# Patient Record
Sex: Female | Born: 1980 | Race: Black or African American | Hispanic: No | Marital: Single | Smoking: Former smoker
Health system: Southern US, Community
[De-identification: ages and names within clinical notes are randomized; demographics above are authoritative.]

## PROBLEM LIST (undated history)

## (undated) DIAGNOSIS — Z1509 Genetic susceptibility to other malignant neoplasm: Secondary | ICD-10-CM

## (undated) DIAGNOSIS — F32A Depression, unspecified: Secondary | ICD-10-CM

## (undated) DIAGNOSIS — F329 Major depressive disorder, single episode, unspecified: Secondary | ICD-10-CM

## (undated) DIAGNOSIS — F419 Anxiety disorder, unspecified: Secondary | ICD-10-CM

## (undated) HISTORY — PX: TUBAL LIGATION: SHX77

## (undated) HISTORY — PX: CERVICAL DISC SURGERY: SHX588

## (undated) HISTORY — DX: Genetic susceptibility to other malignant neoplasm: Z15.09

---
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
## 2001-07-20 ENCOUNTER — Inpatient Hospital Stay: Admission: AD | Admit: 2001-07-20 | Discharge: 2001-07-20 | Payer: Self-pay | Admitting: *Deleted

## 2001-11-06 ENCOUNTER — Emergency Department (HOSPITAL_COMMUNITY): Admission: EM | Admit: 2001-11-06 | Discharge: 2001-11-06 | Payer: Self-pay | Admitting: Emergency Medicine

## 2002-07-22 ENCOUNTER — Encounter: Payer: Self-pay | Admitting: *Deleted

## 2002-07-22 ENCOUNTER — Inpatient Hospital Stay (HOSPITAL_COMMUNITY): Admission: AD | Admit: 2002-07-22 | Discharge: 2002-07-22 | Payer: Self-pay | Admitting: *Deleted

## 2002-08-17 ENCOUNTER — Encounter: Payer: Self-pay | Admitting: *Deleted

## 2002-08-17 ENCOUNTER — Inpatient Hospital Stay (HOSPITAL_COMMUNITY): Admission: AD | Admit: 2002-08-17 | Discharge: 2002-08-17 | Payer: Self-pay | Admitting: Obstetrics and Gynecology

## 2002-09-24 ENCOUNTER — Ambulatory Visit (HOSPITAL_COMMUNITY): Admission: RE | Admit: 2002-09-24 | Discharge: 2002-09-24 | Payer: Self-pay | Admitting: *Deleted

## 2002-10-23 ENCOUNTER — Inpatient Hospital Stay (HOSPITAL_COMMUNITY): Admission: AD | Admit: 2002-10-23 | Discharge: 2002-10-23 | Payer: Self-pay | Admitting: *Deleted

## 2002-11-04 ENCOUNTER — Inpatient Hospital Stay (HOSPITAL_COMMUNITY): Admission: AD | Admit: 2002-11-04 | Discharge: 2002-11-05 | Payer: Self-pay | Admitting: Family Medicine

## 2002-12-20 ENCOUNTER — Inpatient Hospital Stay (HOSPITAL_COMMUNITY): Admission: AD | Admit: 2002-12-20 | Discharge: 2002-12-20 | Payer: Self-pay | Admitting: *Deleted

## 2002-12-27 ENCOUNTER — Inpatient Hospital Stay (HOSPITAL_COMMUNITY): Admission: AD | Admit: 2002-12-27 | Discharge: 2002-12-27 | Payer: Self-pay | Admitting: Obstetrics and Gynecology

## 2003-01-15 ENCOUNTER — Inpatient Hospital Stay (HOSPITAL_COMMUNITY): Admission: AD | Admit: 2003-01-15 | Discharge: 2003-01-15 | Payer: Self-pay | Admitting: *Deleted

## 2003-01-28 ENCOUNTER — Inpatient Hospital Stay (HOSPITAL_COMMUNITY): Admission: AD | Admit: 2003-01-28 | Discharge: 2003-01-28 | Payer: Self-pay | Admitting: Obstetrics & Gynecology

## 2003-02-07 ENCOUNTER — Inpatient Hospital Stay (HOSPITAL_COMMUNITY): Admission: AD | Admit: 2003-02-07 | Discharge: 2003-02-07 | Payer: Self-pay | Admitting: Obstetrics & Gynecology

## 2003-02-07 IMAGING — US US FETAL BPP W/O NONSTRESS
1 series · 14 of 28 positions shown · non-contrast
Comparison: none

CLINICAL DATA: Nonreactive NST.  Assess fetal well-being.

[Series 1: unknown · 0.30mm/px · 14 of 28 slices shown]
[im 2/28]
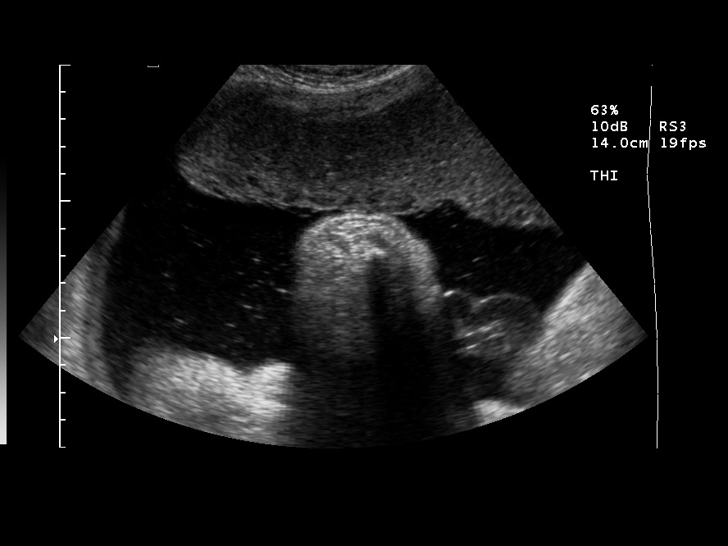
[im 4/28]
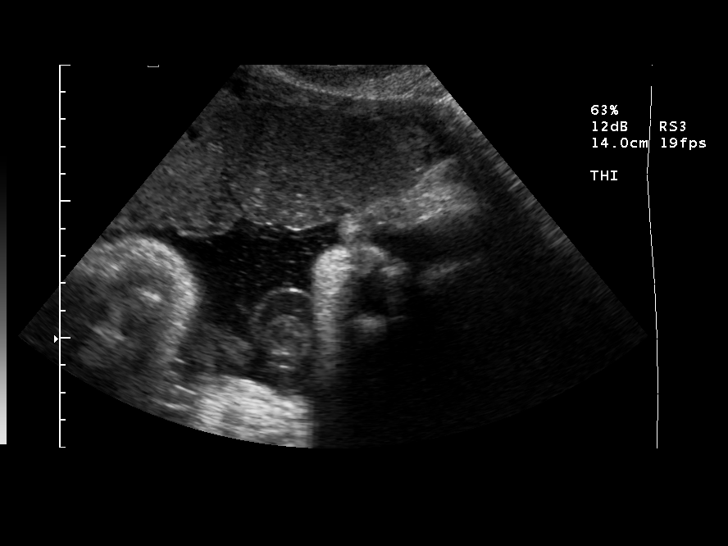
[im 6/28]
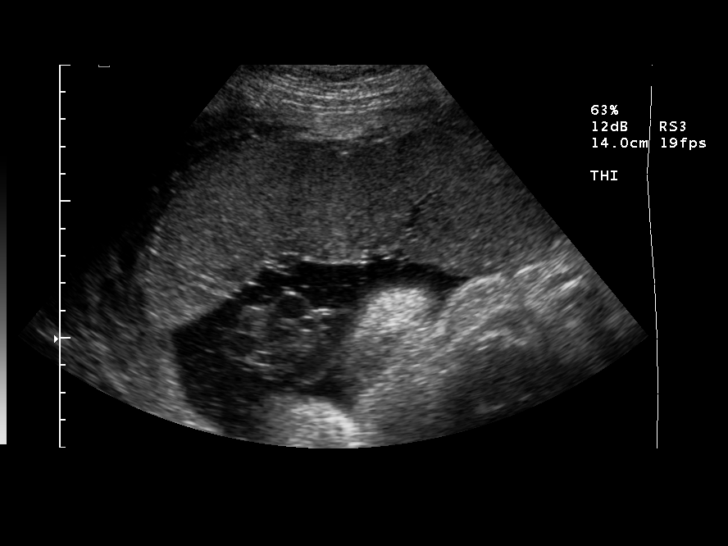
[im 8/28]
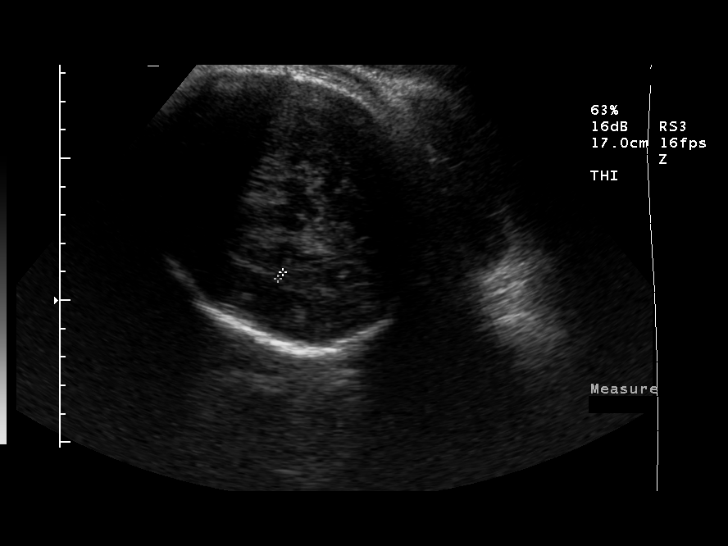
[im 10/28]
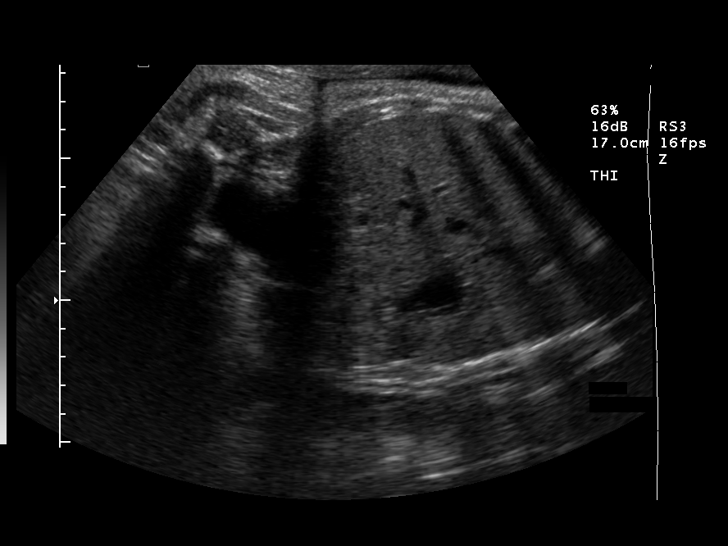
[im 12/28]
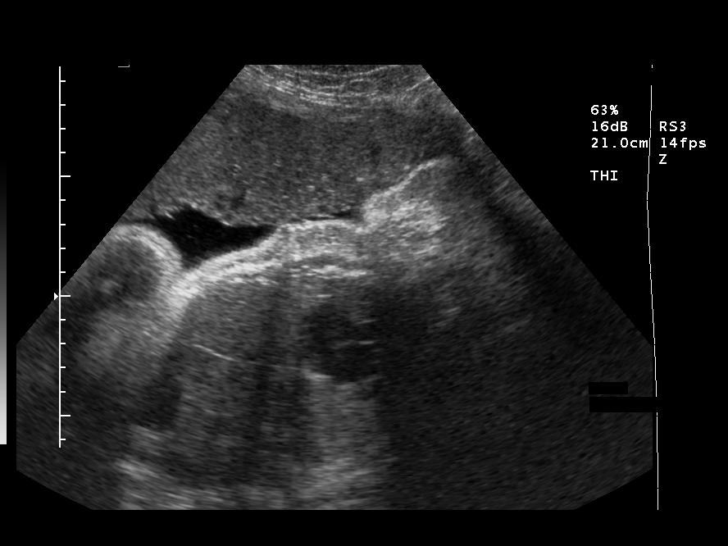
[im 14/28]
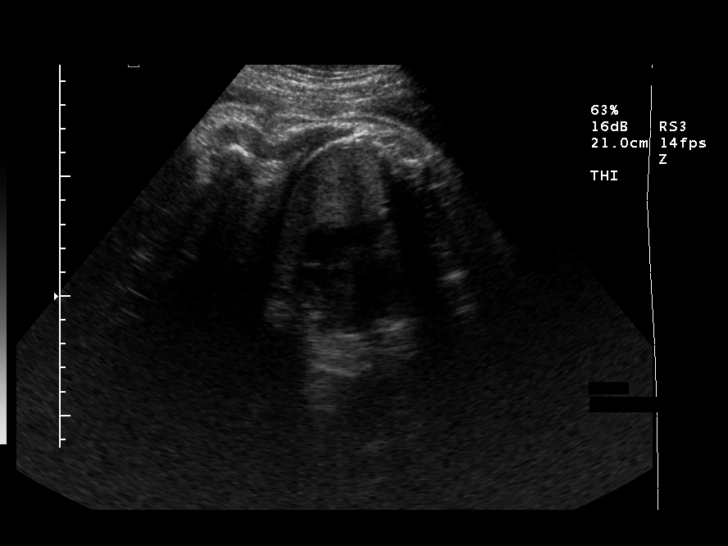
[im 16/28]
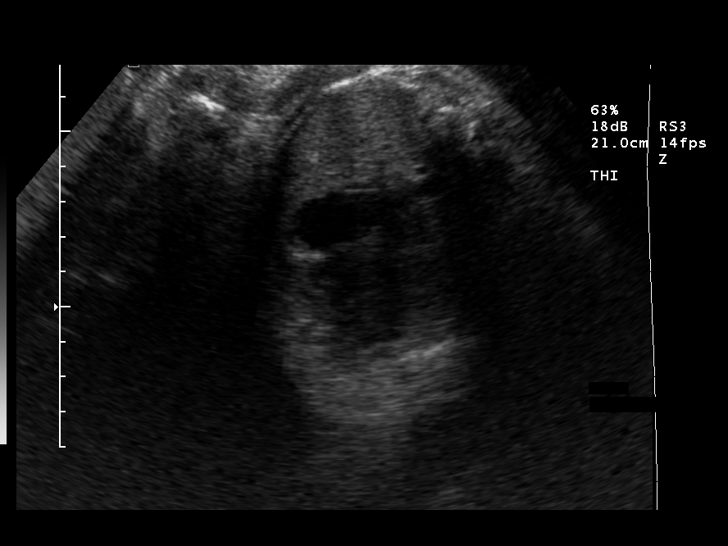
[im 18/28]
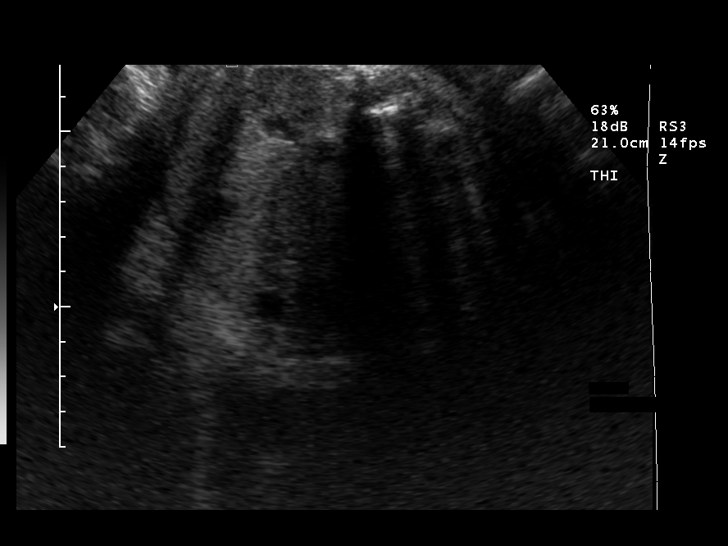
[im 20/28]
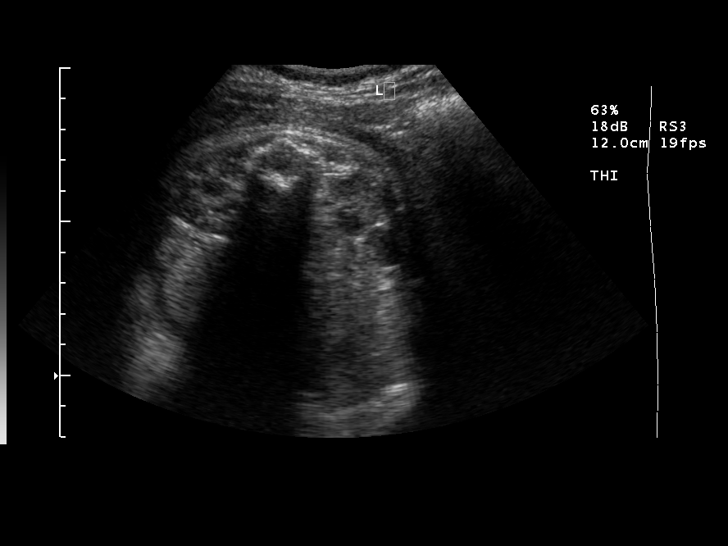
[im 22/28]
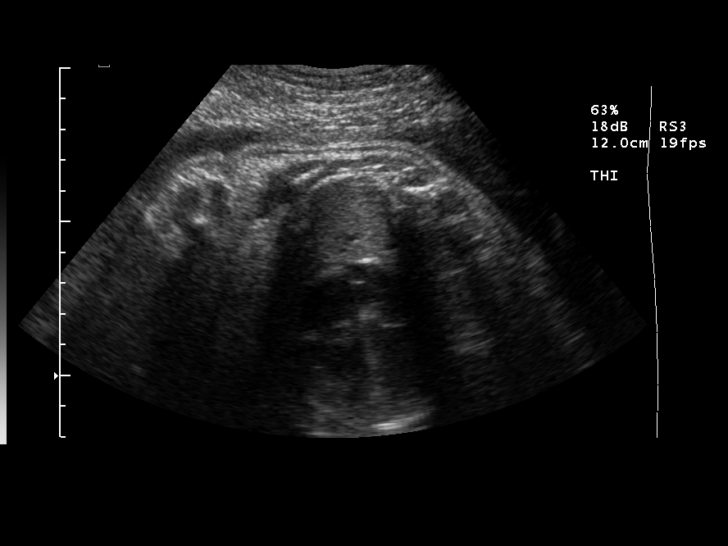
[im 24/28]
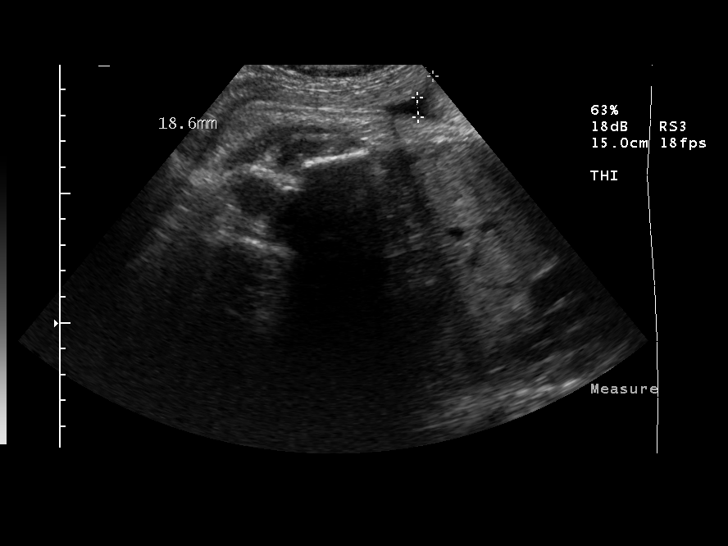
[im 26/28]
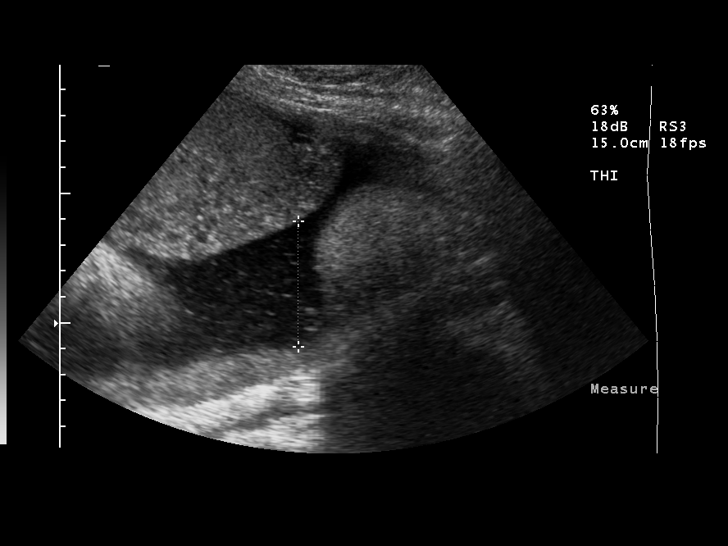
[im 28/28]
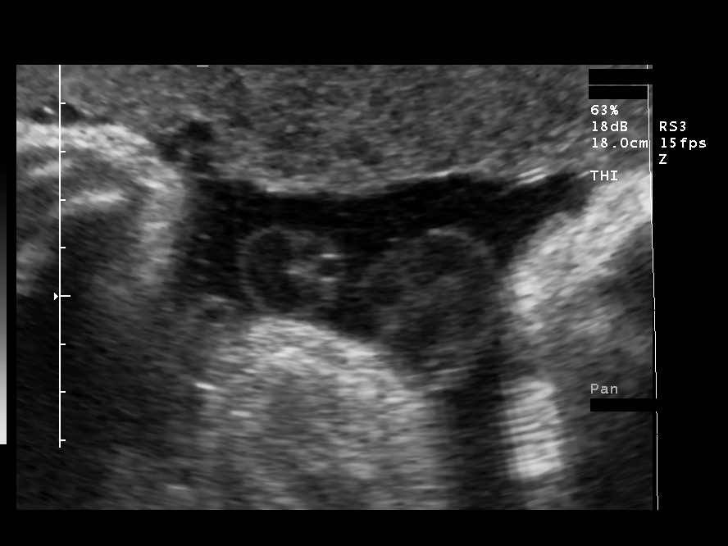

[14 of 28 positions shown; findings below may reference images not displayed]

BIOPHYSICAL PROFILE

Number of Fetuses:  1
Heart rate:  162
Movement:  Yes
Breathing:  Yes
Presentation:  Cephalic
Placental Location:  Anterior
Grade:  I
Previa:  No
Amniotic Fluid (Subjective):  Normal
Amniotic Fluid (Objective):  20.3 cm AFI (5th - 95th%ile =   7.3 ? 23.9 cm for 38 wks)

Fetal measurements and complete anatomic evaluation were not requested.  The following fetal anatomy was visualized on this exam:  Lateral ventricles, four chamber heart, stomach, 3 vessel cord, kidneys, bladder and diaphragm.

BPP SCORING
Movements:  2  Time:  20 minutes
Breathing:  2
Tone:  2
Amniotic Fluid:  2
Total Score:  8

MATERNAL FINDINGS:
Cervix:  Not evaluated

IMPRESSION
Biophysical profile score 8 of 8.  
Subjectively and quantitatively upper normal amniotic fluid volume.

## 2003-02-17 ENCOUNTER — Inpatient Hospital Stay (HOSPITAL_COMMUNITY): Admission: AD | Admit: 2003-02-17 | Discharge: 2003-02-17 | Payer: Self-pay | Admitting: Obstetrics and Gynecology

## 2003-02-23 ENCOUNTER — Inpatient Hospital Stay (HOSPITAL_COMMUNITY): Admission: AD | Admit: 2003-02-23 | Discharge: 2003-02-27 | Payer: Self-pay | Admitting: *Deleted

## 2003-11-29 ENCOUNTER — Other Ambulatory Visit: Admission: RE | Admit: 2003-11-29 | Discharge: 2003-11-29 | Payer: Self-pay | Admitting: Obstetrics and Gynecology

## 2003-12-05 ENCOUNTER — Ambulatory Visit (HOSPITAL_COMMUNITY): Admission: RE | Admit: 2003-12-05 | Discharge: 2003-12-05 | Payer: Self-pay | Admitting: Obstetrics and Gynecology

## 2003-12-05 IMAGING — US US OB COMP +14 WK
1 series · 13 of 28 positions shown · non-contrast
Comparison: none

CLINICAL DATA: Anatomic exam.  Bilateral choroid plexus cysts noted on office exam.

[Series 1: us ob comp +14 wk · 0.29mm/px · 13 of 83 slices shown]
[im 4/83]
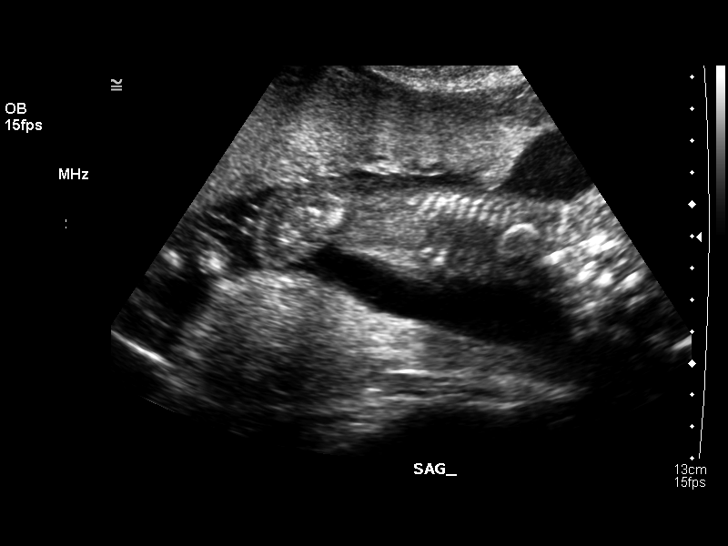
[im 10/83]
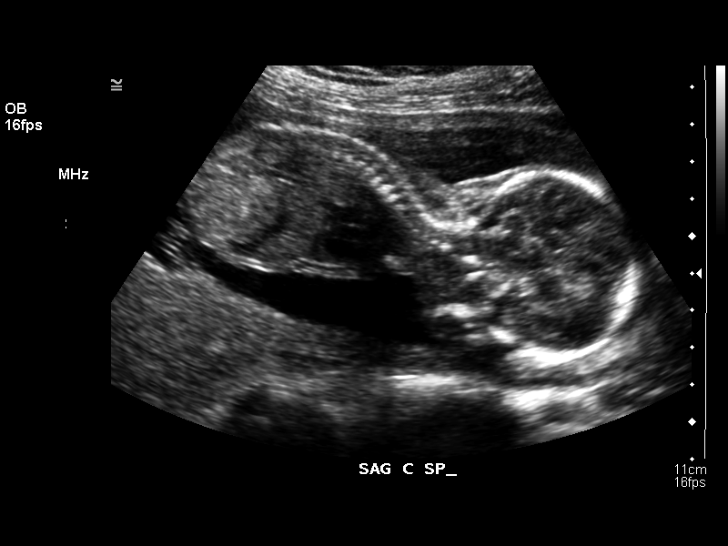
[im 16/83]
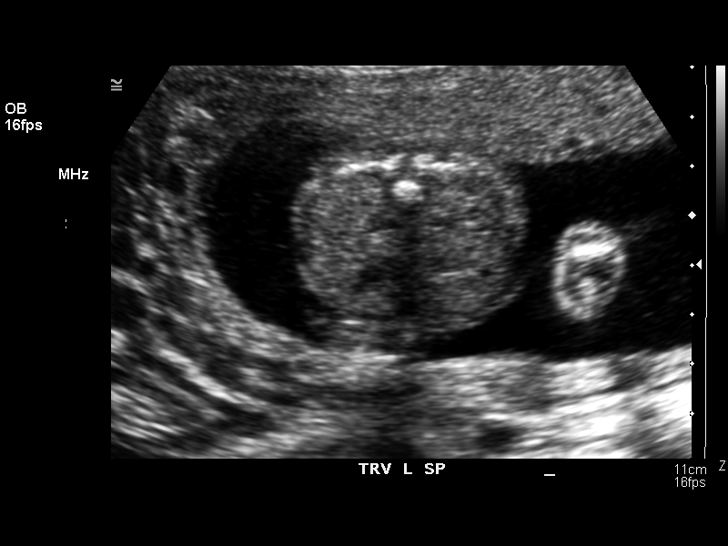
[im 22/83]
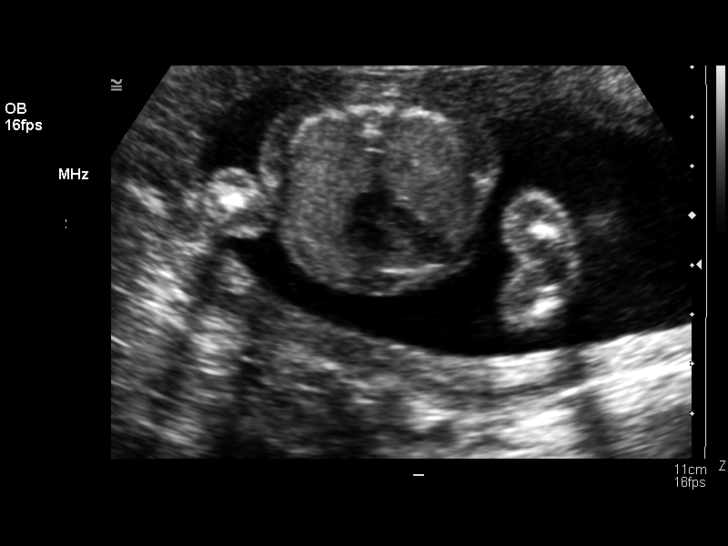
[im 28/83]
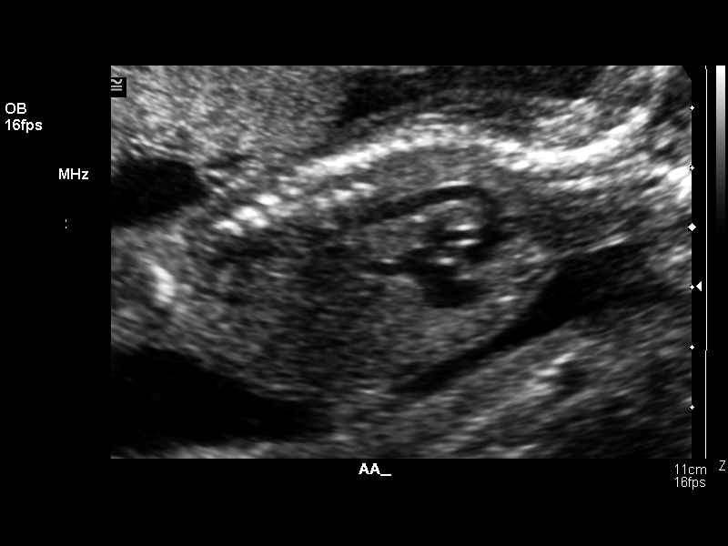
[im 34/83]
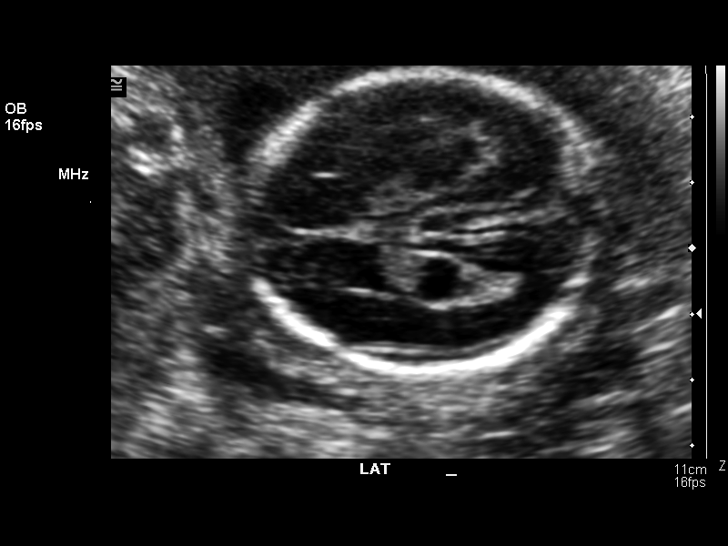
[im 43/83]
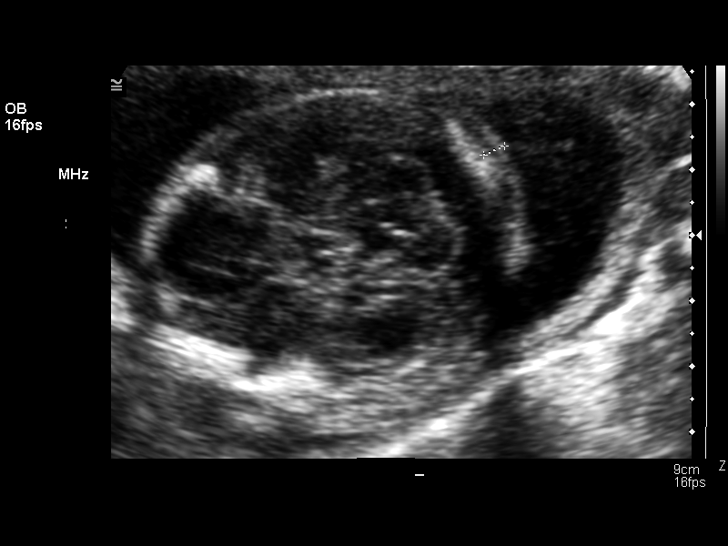
[im 49/83]
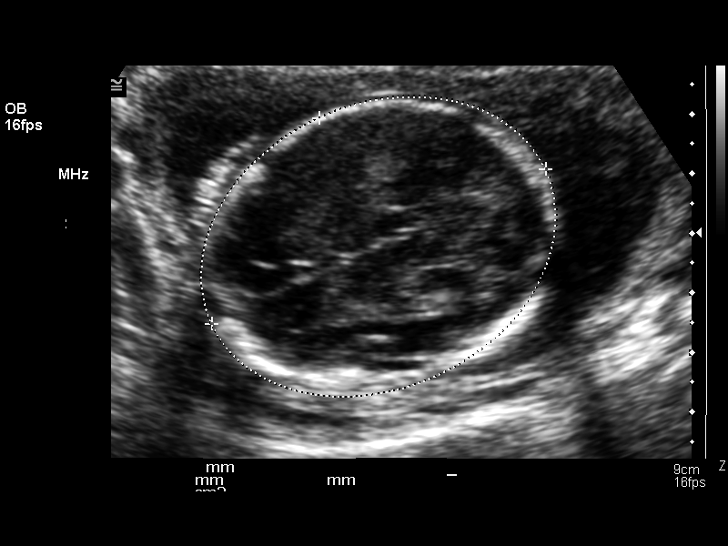
[im 55/83]
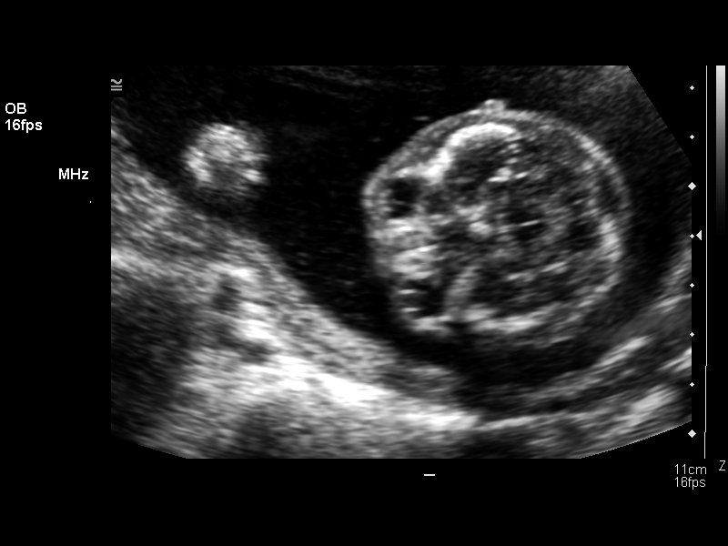
[im 61/83]
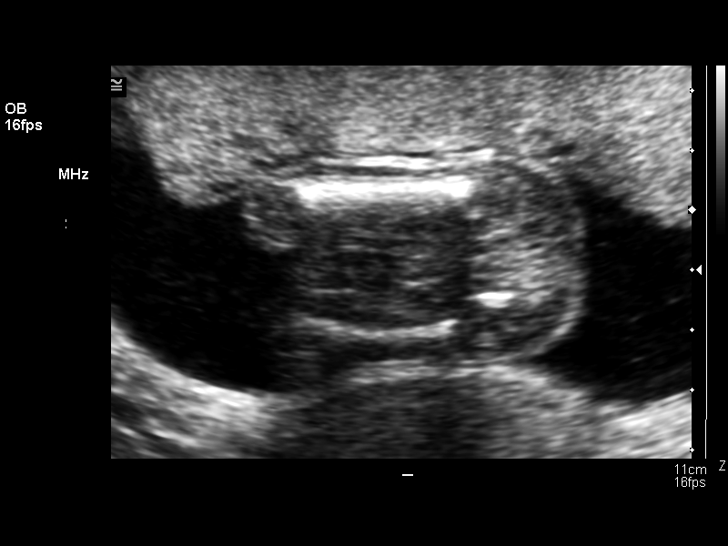
[im 67/83]
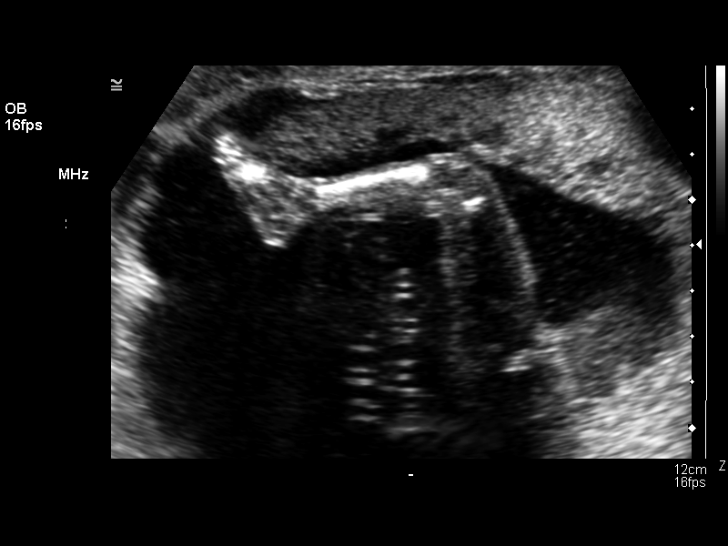
[im 73/83]
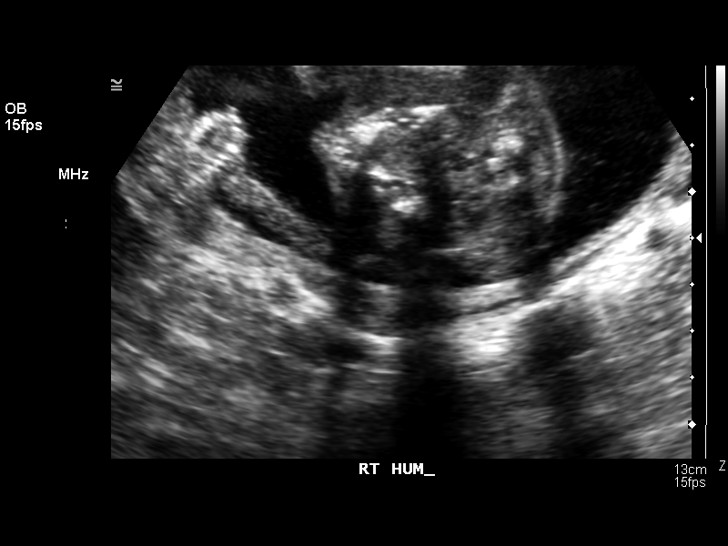
[im 79/83]
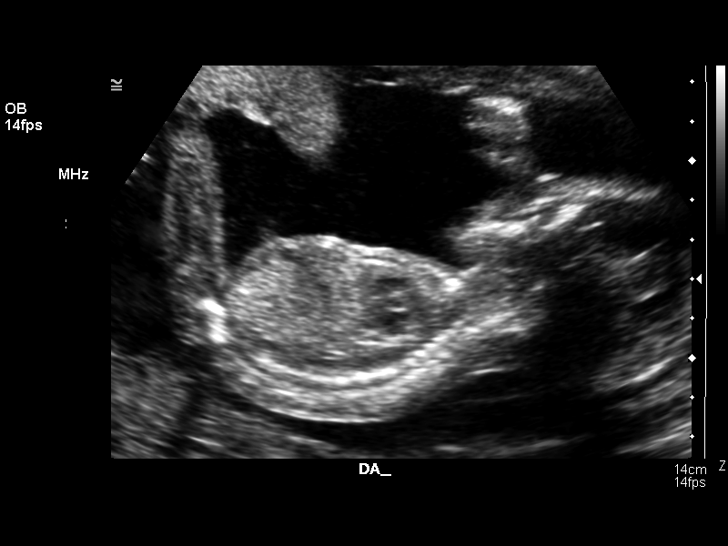

[13 of 28 positions shown; findings below may reference images not displayed]

OBSTETRICAL ULTRASOUND:
Number of Fetuses:  1
Heart Rate:  153
Movement: Yes
Breathing:    No  
Presentation:  Cephalic
Placental Location:  Anterior
Grade:  I
Previa:  No
Amniotic Fluid (Subjective):  Normal
Amniotic Fluid (Objective):   5.1 cm Vertical pocket 

FETAL BIOMETRY
BPD:  4.7 cm   20 w 0 d
HC:  17.3 cm   19 w 6 d
AC:  14.5 cm   19 w 6 d
FL:    3.0 cm   19 w 2 d

MEAN GA:  19 w 5 d

FETAL ANATOMY
Lateral Ventricles:    Visualized 
Thalami/CSP:      Visualized 
Posterior Fossa:  Visualized 
Nuchal Region:    Visualized 
Spine:      Visualized 
4 Chamber Heart on Left:      Visualized 
Stomach on Left:      Visualized 
3 Vessel Cord:    Visualized 
Cord Insertion site:    Visualized 
Kidneys:  Visualized 
Bladder:  Visualized 
Extremities:      Visualized 

ADDITIONAL ANATOMY VISUALIZED:  LVOT, RVOT, upper lip, orbits, profile, diaphragm, heel, 5th digit, ductal arch, aortic arch, and male genitalia.
Comment:  Noted are bilateral choroid plexus cysts measuring 1.0 x 0.7 cm on the left and 1.1 x 0.8 cm on the right.

MATERNAL FINDINGS
Cervix:   3.5 cm Transabdominally
IMPRESSION: Single intrauterine pregnancy demonstrating an estimated gestational age by ultrasound of 19 weeks and 5 days.  Correlation with assigned gestational age by LMP of 19 weeks and 6 days suggests appropriate growth.
Bilateral choroid plexus cysts are confirmed on today?s exam.  No other focal fetal or placental abnormalities are noted with a good anatomic exam possible.  Specifically no sonographic stigmata of Trisomy 18 were noted.  Intrauterine growth restriction with associated polyhydramnios is a finding seen in the second trimester with Trisomy 18 and for this reason, short term follow-up to assess for appropriate interval growth would be recommended at 24 weeks.  
Scan results were discussed with the patient.

</u12:p>

## 2004-02-01 ENCOUNTER — Inpatient Hospital Stay (HOSPITAL_COMMUNITY): Admission: AD | Admit: 2004-02-01 | Discharge: 2004-02-01 | Payer: Self-pay | Admitting: Obstetrics and Gynecology

## 2004-04-21 ENCOUNTER — Inpatient Hospital Stay (HOSPITAL_COMMUNITY): Admission: AD | Admit: 2004-04-21 | Discharge: 2004-04-25 | Payer: Self-pay | Admitting: Obstetrics and Gynecology

## 2004-04-21 ENCOUNTER — Encounter (INDEPENDENT_AMBULATORY_CARE_PROVIDER_SITE_OTHER): Payer: Self-pay | Admitting: Specialist

## 2005-06-26 ENCOUNTER — Emergency Department (HOSPITAL_COMMUNITY): Admission: EM | Admit: 2005-06-26 | Discharge: 2005-06-26 | Payer: Self-pay | Admitting: Emergency Medicine

## 2005-06-26 IMAGING — CT CT HEAD W/O CM
1 of 2 series · 13 of 30 positions shown, 17 images · non-contrast
Comparison: none

CLINICAL DATA: Migraine headache.
 HEAD CT WITHOUT CONTRAST ? [DATE]:
TECHNIQUE: Contiguous axial CT images were obtained from the base of the skull through the vertex according to standard protocol without contrast.

[Series 2: brain · axial · 0.47mm/px · z∈[-142,-11]mm · 13 of 32 slices shown, 17 images]
[im 3/32  brain]
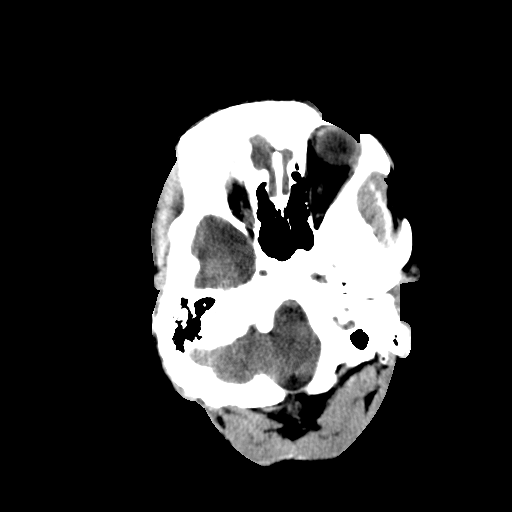
[im 3/32  bone]
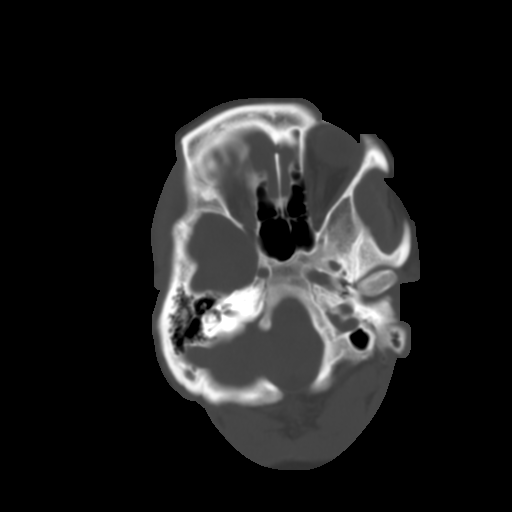
[im 5/32  brain]
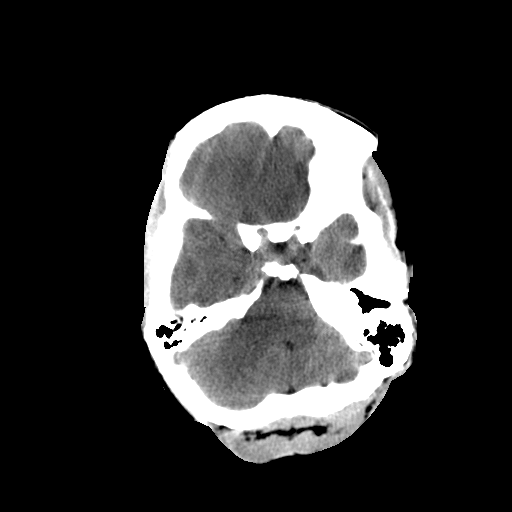
[im 7/32  brain]
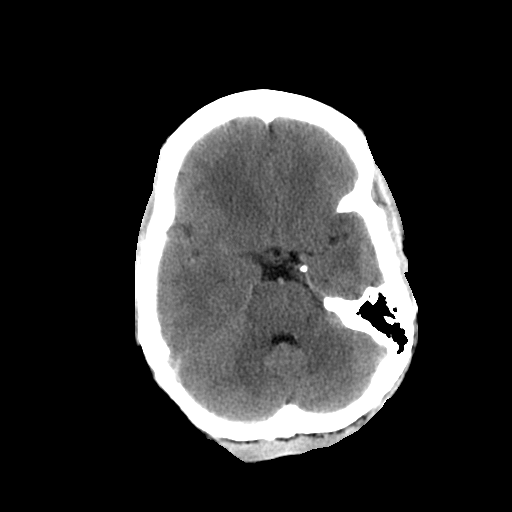
[im 9/32  brain]
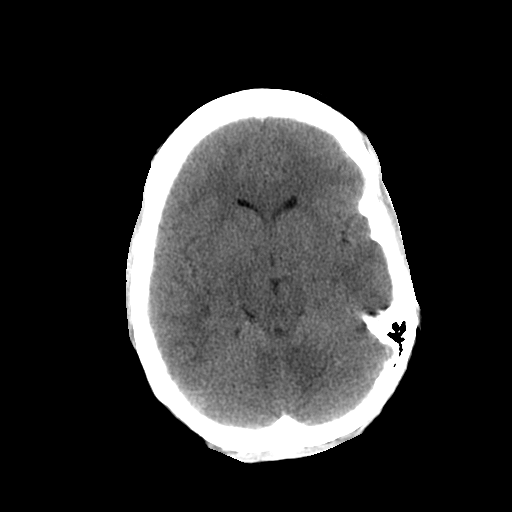
[im 12/32  brain]
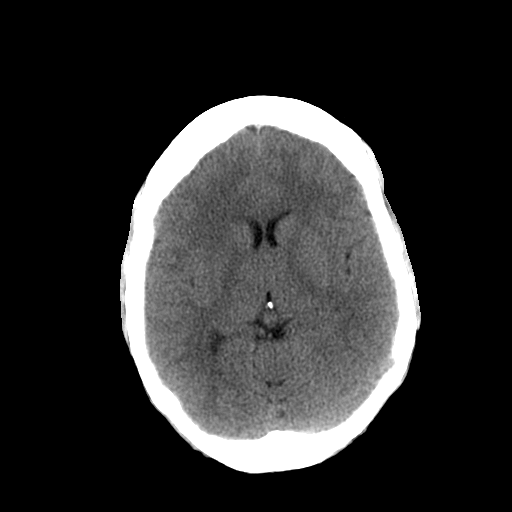
[im 12/32  bone]
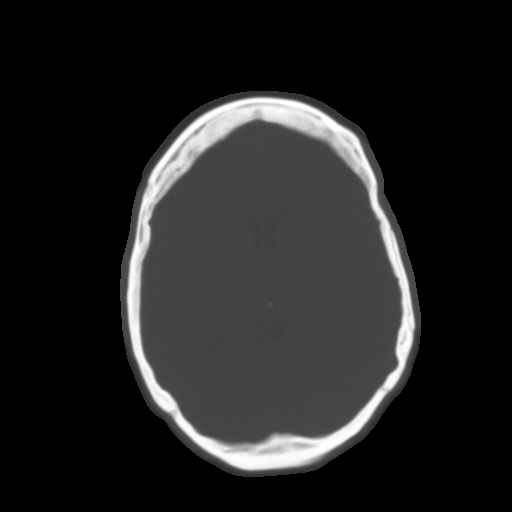
[im 14/32  brain]
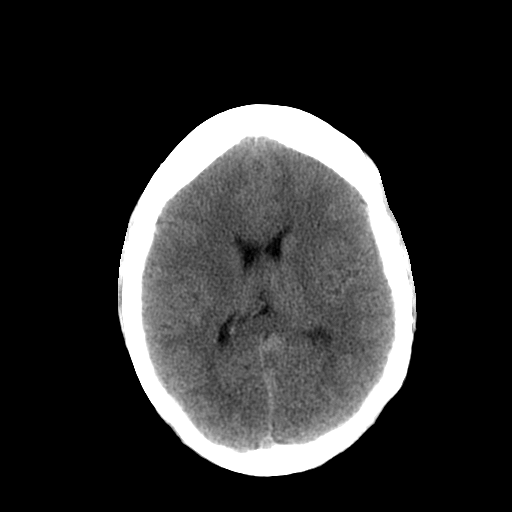
[im 16/32  brain]
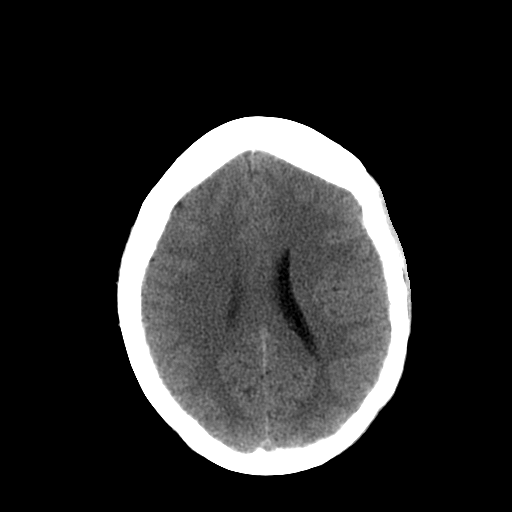
[im 18/32  brain]
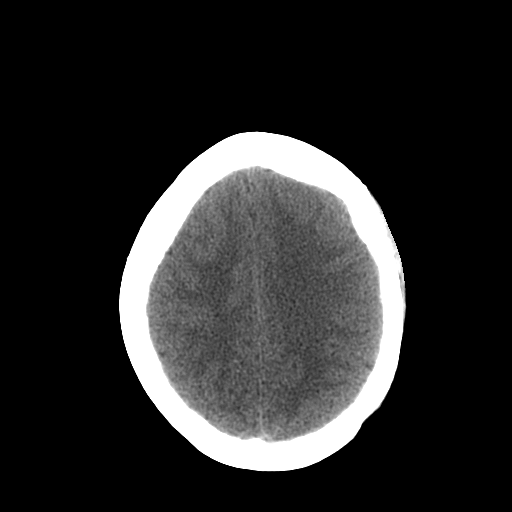
[im 20/32  brain]
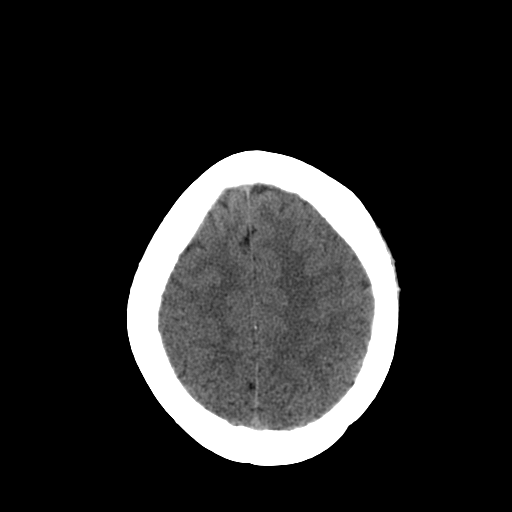
[im 20/32  bone]
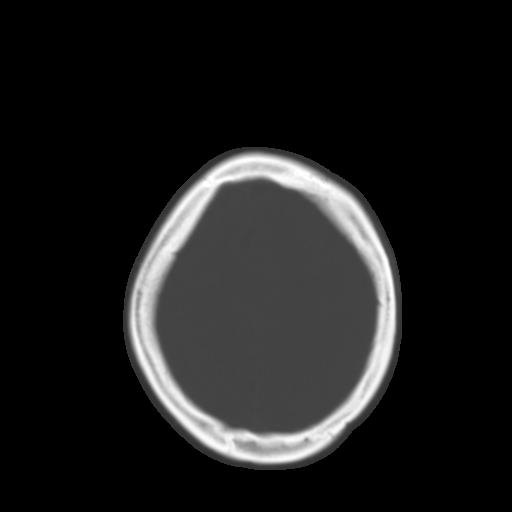
[im 23/32  brain]
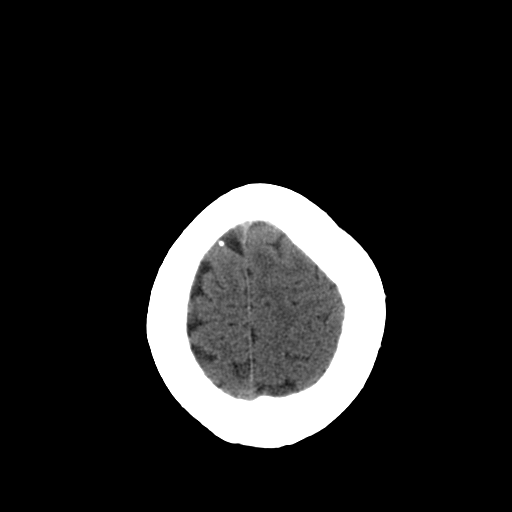
[im 25/32  brain]
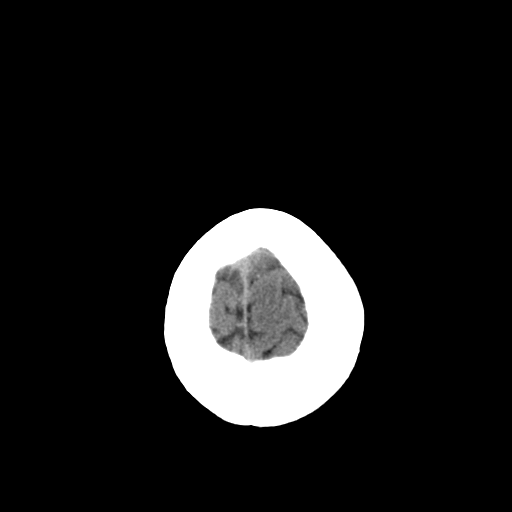
[im 27/32  brain]
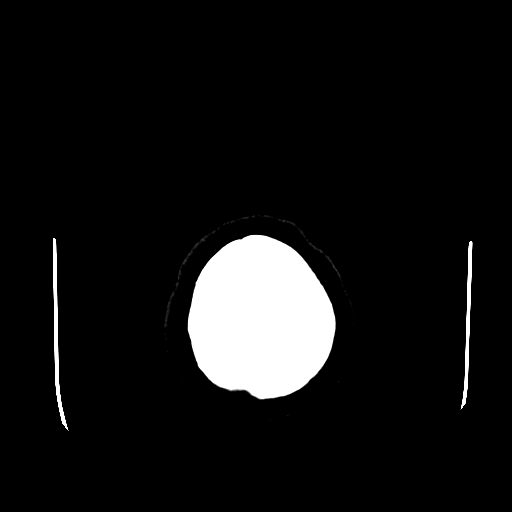
[im 29/32  brain]
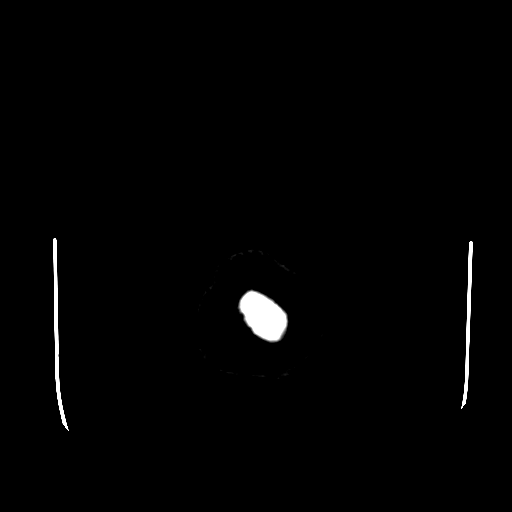
[im 29/32  bone]
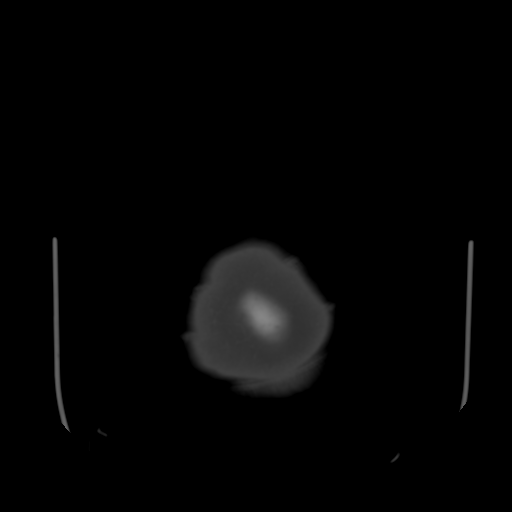

[13 of 30 positions shown; findings below may reference images not displayed]

FINDINGS: The ventricular system is normal in size and configuration and the septum is in a normal midline position.  The fourth ventricle and basilar cisterns appear normal.  No acute intracranial abnormality is seen.  No mass effect is noted.  No bony abnormality is seen.
IMPRESSION: Negative CT brain scan.

## 2006-03-14 ENCOUNTER — Inpatient Hospital Stay (HOSPITAL_COMMUNITY): Admission: AD | Admit: 2006-03-14 | Discharge: 2006-03-14 | Payer: Self-pay | Admitting: Obstetrics

## 2006-04-01 ENCOUNTER — Inpatient Hospital Stay (HOSPITAL_COMMUNITY): Admission: AD | Admit: 2006-04-01 | Discharge: 2006-04-01 | Payer: Self-pay | Admitting: Obstetrics

## 2006-05-13 ENCOUNTER — Inpatient Hospital Stay (HOSPITAL_COMMUNITY): Admission: AD | Admit: 2006-05-13 | Discharge: 2006-05-13 | Payer: Self-pay | Admitting: Obstetrics

## 2006-05-14 ENCOUNTER — Inpatient Hospital Stay (HOSPITAL_COMMUNITY): Admission: AD | Admit: 2006-05-14 | Discharge: 2006-05-14 | Payer: Self-pay | Admitting: Obstetrics

## 2006-08-12 ENCOUNTER — Inpatient Hospital Stay (HOSPITAL_COMMUNITY): Admission: AD | Admit: 2006-08-12 | Discharge: 2006-08-16 | Payer: Self-pay | Admitting: Obstetrics

## 2006-08-13 ENCOUNTER — Encounter (INDEPENDENT_AMBULATORY_CARE_PROVIDER_SITE_OTHER): Payer: Self-pay | Admitting: Obstetrics

## 2010-06-09 NOTE — H&P (Signed)
[name], [name]                 ACCOUNT NO.:  [id number]   MEDICAL RECORD NO.:  [id number]          PATIENT TYPE:  INP   LOCATION:  9112                          FACILITY:  [facility]   PHYSICIAN:  Kathreen Cosier, M.D.DATE OF BIRTH:  [birth year]   DATE OF ADMISSION:  08/12/2006  DATE OF DISCHARGE:                              HISTORY & PHYSICAL   HISTORY OF PRESENT ILLNESS:  The patient is a 30 year old gravida 4,  para 3-0-0-3, [facility] August 6, admitted in labor, cervix 4-5 cm.  She got  an epidural.  She had a positive chlamydia for which she had been  treated but she had sex with her father of baby after he has not been  treated.  She was given Zithromax 1 gram IV.  After epidural the patient  was 6 cm dilated and the vertex at minus 2 station.  Membranes were  ruptured artificially and no flow.  Clear fluid noted.  There was old  dark blood, more than 500 mL and after the initial outpouring of more  than 100 mL, she kept on bleeding.  Fetal heart was still normal and the  bleeding did not cease.  It was decided she would be delivered by C-  section and her uterus also was firm with no relaxation and abruption  was suspected.   PHYSICAL EXAMINATION:  GENERAL:  Physical exam reveals an obese female  in labor.  HEENT:  Negative.  LUNGS:  Clear.  HEART:  Regular rhythm.  No murmurs or gallops.  BREASTS:  No masses.  ABDOMEN:  Term size uterus.  Fetal heart rate 158.  PELVIC:  As described above.  EXTREMITIES:  Negative.           ______________________________  Kathreen Cosier, M.D.     BAM/MEDQ  D:  08/13/2006  T:  08/14/2006  Job:  [id number]

## 2010-06-09 NOTE — Op Note (Signed)
[name], [name]                 ACCOUNT NO.:  [id number]   MEDICAL RECORD NO.:  [id number]          PATIENT TYPE:  INP   LOCATION:  9112                          [facility]ILITY:  [facility]   PHYSICIAN:  Kathreen Cosier, M.D.DATE OF BIRTH:  [birth year]   DATE OF PROCEDURE:  08/13/2006  DATE OF DISCHARGE:                               OPERATIVE REPORT   PREOPERATIVE DIAGNOSIS:  Possible abruption of placenta.   SURGEON:  Kathreen Cosier, M.D.   ANESTHESIA:  Epidural.   The patient was placed on the operating table in the supine position.  The abdomen was prepped and draped, the bladder emptied with a Foley  catheter.  Transverse suprapubic incision made carried down to the  rectus fascia.  The fascia cleanly incised the length of the incision.  Recti muscles were retracted laterally.  Peritoneum incised  longitudinally.  Transverse incision made in the visceral peritoneum  above the bladder and a transverse lower uterine incision made.  The  uterus was full of old blood. No amniotic fluid was noted.  The fetus  stained with blood, female, Apgar 8/9, weighing 7 pound 1 ounce.  Placenta removed manually and sent to pathology. On the fetal side of  the placenta, it was noted there was a large cyst and a lot of staining  with old blood.  There was a possible abruption.  The uterine cavity was  cleaned with dry laps.  Uterine incision closed in one layer with  continuous suture of #1 chromic.  Hemostasis satisfactory.  Bladder flap  reattached with 2-0 chromic.  The uterus was well contracted.  The right  tube grasped in the mid portion with a Babcock clamp.  0 plain suture  placed in the mesosalpinx below the portion of tube within the clamp and  this was tied. Approximately 1 inch of tube transected, hemostasis  satisfactory.  The procedure was done in a similar fashion on the other  side.  Lap and sponge counts correct.  Abdomen closed in layers,  peritoneum continuous suture of 0  chromic, fascia continuous suture of  #1 Dexon, skin closed with subcuticular stitch of 4-0 Monocryl.  Blood  loss less than 100 mL.           ______________________________  Kathreen Cosier, M.D.     BAM/MEDQ  D:  08/13/2006  T:  08/13/2006  Job:  [id number]
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
## 2010-06-12 NOTE — Discharge Summary (Signed)
NAMEMarland Phillips  ATLEIGH, GRUEN                 ACCOUNT NO.:  [id number]   MEDICAL RECORD NO.:  [id number]          PATIENT TYPE:  INP   LOCATION:  9112                          [facility]ACILITY:  [facility]   PHYSICIAN:  Kathreen Cosier, M.D.DATE OF BIRTH:  [birth year]   DATE OF ADMISSION:  08/12/2006  DATE OF DISCHARGE:  08/16/2006                               DISCHARGE SUMMARY   The patient is a 30 year old gravida 4, para 3-0-0-3, [facility] August 6,  admitted in labor.  Cervix 4 cm to 5 cm.  She had epidural she had  positive Chlamydia. Membranes ruptured at 6 cm and minus two station and  large amounts of bright red blood on rupture in the membranes. The blood  exceeded 500 mL. It was then noted the uterus became continuously firm  and the bleeding continued and it was decided she would be delivered by  C-section for possible abruption and she also desired tubal ligation.   HOSPITAL COURSE:  Large amount of blood noted in the uterus and on the  fetal side of placenta a large cyst was noted.  Placenta was sent to  pathology.  The female Apgar 08/09, weighing 7 pounds 1 ounce. Blood  loss 1100 mL. Tubal ligation was performed.  Her hemoglobin was 8.4 and  on day #2 it was 6.  She was started on ferrous sulfate twice a day.  She was asymptomatic and was discharged home on the third postoperative  day ambulatory, on a regular diet, on Tylox for pain and ferrous sulfate  twice a day.   DISCHARGE DIAGNOSIS:  Status post possible abruption.           ______________________________  Kathreen Cosier, M.D.     BAM/MEDQ  D:  09/07/2006  T:  09/07/2006  Job:  [id number]

## 2010-08-02 ENCOUNTER — Emergency Department (HOSPITAL_COMMUNITY)
Admission: EM | Admit: 2010-08-02 | Discharge: 2010-08-02 | Disposition: A | Discharge status: Home / Self Care | Payer: No Typology Code available for payment source | Attending: Emergency Medicine | Admitting: Emergency Medicine

## 2010-08-02 DIAGNOSIS — Y92410 Unspecified street and highway as the place of occurrence of the external cause: Secondary | ICD-10-CM | POA: Insufficient documentation

## 2010-08-02 DIAGNOSIS — S139XXA Sprain of joints and ligaments of unspecified parts of neck, initial encounter: Secondary | ICD-10-CM | POA: Insufficient documentation

## 2010-08-02 DIAGNOSIS — Y998 Other external cause status: Secondary | ICD-10-CM | POA: Insufficient documentation

## 2010-08-02 DIAGNOSIS — IMO0002 Reserved for concepts with insufficient information to code with codable children: Secondary | ICD-10-CM | POA: Insufficient documentation

## 2010-08-02 DIAGNOSIS — R071 Chest pain on breathing: Secondary | ICD-10-CM | POA: Insufficient documentation

## 2010-08-02 DIAGNOSIS — R51 Headache: Secondary | ICD-10-CM | POA: Insufficient documentation

## 2010-11-09 LAB — CBC
HCT: 18.2 — ABNORMAL LOW
Hemoglobin: 6 — CL
Hemoglobin: 8.4 — ABNORMAL LOW
MCHC: 33.3
MCV: 79.9
MCV: 80.9
Platelets: 199
RBC: 3.17 — ABNORMAL LOW
RDW: 17.9 — ABNORMAL HIGH
WBC: 11.1 — ABNORMAL HIGH
WBC: 8.5

## 2010-11-09 LAB — RPR: RPR Ser Ql: NONREACTIVE

## 2011-01-11 ENCOUNTER — Emergency Department (HOSPITAL_COMMUNITY)
Admission: EM | Admit: 2011-01-11 | Discharge: 2011-01-11 | Disposition: A | Discharge status: Home / Self Care | Payer: Self-pay | Attending: Emergency Medicine | Admitting: Emergency Medicine

## 2011-01-11 ENCOUNTER — Encounter: Payer: Self-pay | Admitting: Emergency Medicine

## 2011-01-11 DIAGNOSIS — R6884 Jaw pain: Secondary | ICD-10-CM | POA: Insufficient documentation

## 2011-01-11 DIAGNOSIS — K029 Dental caries, unspecified: Secondary | ICD-10-CM | POA: Insufficient documentation

## 2011-01-11 DIAGNOSIS — Z79899 Other long term (current) drug therapy: Secondary | ICD-10-CM | POA: Insufficient documentation

## 2011-01-11 DIAGNOSIS — R10819 Abdominal tenderness, unspecified site: Secondary | ICD-10-CM | POA: Insufficient documentation

## 2011-01-11 DIAGNOSIS — N390 Urinary tract infection, site not specified: Secondary | ICD-10-CM | POA: Insufficient documentation

## 2011-01-11 DIAGNOSIS — M549 Dorsalgia, unspecified: Secondary | ICD-10-CM | POA: Insufficient documentation

## 2011-01-11 DIAGNOSIS — N898 Other specified noninflammatory disorders of vagina: Secondary | ICD-10-CM | POA: Insufficient documentation

## 2011-01-11 DIAGNOSIS — R300 Dysuria: Secondary | ICD-10-CM | POA: Insufficient documentation

## 2011-01-11 DIAGNOSIS — M545 Low back pain, unspecified: Secondary | ICD-10-CM | POA: Insufficient documentation

## 2011-01-11 DIAGNOSIS — F17200 Nicotine dependence, unspecified, uncomplicated: Secondary | ICD-10-CM | POA: Insufficient documentation

## 2011-01-11 DIAGNOSIS — N739 Female pelvic inflammatory disease, unspecified: Secondary | ICD-10-CM | POA: Insufficient documentation

## 2011-01-11 DIAGNOSIS — R109 Unspecified abdominal pain: Secondary | ICD-10-CM | POA: Insufficient documentation

## 2011-01-11 HISTORY — DX: Major depressive disorder, single episode, unspecified: F32.9

## 2011-01-11 HISTORY — DX: Depression, unspecified: F32.A

## 2011-01-11 LAB — URINE MICROSCOPIC-ADD ON

## 2011-01-11 LAB — URINALYSIS, ROUTINE W REFLEX MICROSCOPIC
Bilirubin Urine: NEGATIVE
Ketones, ur: NEGATIVE mg/dL
Specific Gravity, Urine: 1.024 (ref 1.005–1.030)
pH: 7.5 (ref 5.0–8.0)

## 2011-01-11 MED ORDER — METRONIDAZOLE 500 MG PO TABS: 500.0000 mg | ORAL_TABLET | Freq: Two times a day (BID) | ORAL | Status: AC

## 2011-01-11 MED ORDER — DOXYCYCLINE HYCLATE 100 MG PO TABS
100.0000 mg | ORAL_TABLET | Freq: Once | ORAL | Status: AC
  Administered 2011-01-11: 100 mg via ORAL
  Filled 2011-01-11: qty 1

## 2011-01-11 MED ORDER — CEFTRIAXONE SODIUM 250 MG IJ SOLR
250.0000 mg | Freq: Once | INTRAMUSCULAR | Status: AC
  Administered 2011-01-11: 250 mg via INTRAMUSCULAR
  Filled 2011-01-11: qty 250

## 2011-01-11 MED ORDER — LIDOCAINE HCL (PF) 1 % IJ SOLN
2.0000 mL | Freq: Once | INTRAMUSCULAR | Status: AC
  Administered 2011-01-11: 2 mL
  Filled 2011-01-11: qty 5

## 2011-01-11 MED ORDER — DOXYCYCLINE HYCLATE 100 MG PO TABS: 100.0000 mg | ORAL_TABLET | Freq: Two times a day (BID) | ORAL | Status: AC

## 2011-01-11 MED ORDER — METRONIDAZOLE 500 MG PO TABS
500.0000 mg | ORAL_TABLET | Freq: Once | ORAL | Status: AC
  Administered 2011-01-11: 500 mg via ORAL
  Filled 2011-01-11: qty 1

## 2011-01-11 NOTE — ED Notes (Signed)
Back pain x 3 months and rt groin pain had blood in ua  Last night  And she has d/c

## 2011-01-11 NOTE — ED Provider Notes (Signed)
History     CSN: [id number] Arrival date & time: 01/11/2011 11:33 AM   First MD Initiated Contact with Patient 01/11/11 1545      No chief complaint on file.   (Consider location/radiation/quality/duration/timing/severity/associated sxs/prior treatment) HPI Patient is a 30 year old female who presents today with numerous complaints. Among these are bilateral lower back pain. Patient says this has been present for months. The past several days patient has had dysuria as well as blood in her urine. She also complains of some vaginal discharge. She states this is not significantly different from her normal but she does have concerns about a recent sexual partner. Patient complains of some suprapubic pain as well. Her lower back pain and suprapubic pain as a 5/10. This is made worse with urinating nothing has made it better. Patient also complains of left posterior mandibular pain associated with dental caries. She's not seen a dentist. Patient does not believe she could be pregnant as she has had a tubal ligation previously. She does note that with her period which she just completed she has been passing blood clots. She denies any chest pain, shortness of breath, or lightheadedness. Patient denies any incontinence or neurologic symptoms associated with her back pain. She does note that from time to time her right leg does go numb and this appears to be related to the position she is sitting in. There are no other associated or modifying factors. Past Medical History  Diagnosis Date  . Depression   . Attention deficit disorder     Past Surgical History  Procedure Date  . Tubal ligation     History reviewed. No pertinent family history.  History  Substance Use Topics  . Smoking status: Current Everyday Smoker  . Smokeless tobacco: Not on file  . Alcohol Use: Yes    OB History    Grav Para Term Preterm Abortions TAB SAB Ect Mult Living                  Review of Systems    Constitutional: Negative.   HENT: Negative.   Eyes: Negative.   Respiratory: Negative.   Cardiovascular: Negative.   Gastrointestinal: Positive for abdominal pain.  Genitourinary: Positive for dysuria, hematuria, vaginal discharge and menstrual problem.  Musculoskeletal: Positive for back pain.  Skin: Negative.   Hematological: Negative.   Psychiatric/Behavioral: Negative.   All other systems reviewed and are negative.    Allergies  Review of patient's allergies indicates no known allergies.  Home Medications   Current Outpatient Rx  Name Route Sig Dispense Refill  . METHYLPHENIDATE HCL 10 MG PO TABS Oral Take 10 mg by mouth 2 (two) times daily.      Marland Kitchen MIRTAZAPINE 15 MG PO TABS Oral Take 30 mg by mouth at bedtime.        BP 122/67  Pulse 86  Temp(Src) 98.3 F (36.8 C) (Oral)  Resp 16  SpO2 99%  Physical Exam  Nursing note and vitals reviewed. Constitutional: She is oriented to person, place, and time. She appears well-developed and well-nourished. No distress.  HENT:  Head: Normocephalic and atraumatic.  Eyes: Conjunctivae and EOM are normal. Pupils are equal, round, and reactive to light.  Neck: Normal range of motion.  Cardiovascular: Normal rate, regular rhythm, normal heart sounds and intact distal pulses.  Exam reveals no gallop and no friction rub.   No murmur heard. Pulmonary/Chest: Breath sounds normal. No respiratory distress. She has no wheezes. She has no rales.  Abdominal: Soft. Bowel  sounds are normal. She exhibits no distension. There is tenderness. There is no rebound and no guarding.       Mild Suprapubic tenderness  Genitourinary: Uterus normal. Pelvic exam was performed with patient supine. Cervix exhibits motion tenderness and discharge. Right adnexum displays tenderness. Right adnexum displays no fullness. Left adnexum displays tenderness. Left adnexum displays no fullness. Vaginal discharge found.  Musculoskeletal: Normal range of motion. She  exhibits tenderness. She exhibits no edema.       Mild lower back pain in the paraspinous muscles at levels L4 and L5  Neurological: She is alert and oriented to person, place, and time. No cranial nerve deficit. She exhibits normal muscle tone. Coordination normal.  Skin: Skin is warm and dry. No rash noted.  Psychiatric: She has a normal mood and affect.    ED Course  Procedures (including critical care time)  Labs Reviewed  URINALYSIS, ROUTINE W REFLEX MICROSCOPIC - Abnormal; Notable for the following:    APPearance CLOUDY (*)    Hgb urine dipstick SMALL (*)    Nitrite POSITIVE (*)    Leukocytes, UA LARGE (*)    All other components within normal limits  URINE MICROSCOPIC-ADD ON - Abnormal; Notable for the following:    Squamous Epithelial / LPF MANY (*)    Bacteria, UA MANY (*)    All other components within normal limits  WET PREP, GENITAL - Abnormal; Notable for the following:    Trich, Wet Prep FEW (*)    Clue Cells, Wet Prep FEW (*)    WBC, Wet Prep HPF POC MODERATE (*)    All other components within normal limits  POCT PREGNANCY, URINE  GC/CHLAMYDIA PROBE AMP, GENITAL   No results found.   1. Pelvic inflammatory disease   2. Dental caries   3. UTI (lower urinary tract infection)       MDM  Patient was evaluated and had numerous complaints. We agreed that I would refer her to a dentist for dental follow-up. We also did discuss that she had a negative pregnancy test but evidence of a urinary tract infection on urinalysis. Patient has orders in for pelvic exam to be performed. Given her concerns regarding infection transmitted disease he will perform this. Patient also reports she has not had a Pap smear for several years. I will refer [facility] to [facility] as well for followup.   Wet prep did show trichomonas. Patient was treated as pelvic inflammatory disease. She was given Rocephin IM as well as oral doxycycline and Flagyl. She was discharged with 2 weeks of  both of these oral medications. This should also cover any urinary pathology. Patient was also referred to a dentist for dental caries. She was instructed to followup with an OB/GYN when she completes her antibiotics. Patient was discharged in good condition.        Cyndra Numbers, MD 01/11/11 [phone]

## 2011-01-11 NOTE — ED Notes (Signed)
States cannot bepreg has had tubal

## 2011-01-12 LAB — GC/CHLAMYDIA PROBE AMP, GENITAL
Chlamydia, DNA Probe: NEGATIVE
GC Probe Amp, Genital: NEGATIVE

## 2011-01-18 ENCOUNTER — Emergency Department (HOSPITAL_COMMUNITY)
Admission: EM | Admit: 2011-01-18 | Discharge: 2011-01-19 | Disposition: A | Discharge status: Other Acute Inpatient Hospital | Payer: Self-pay | Attending: Emergency Medicine | Admitting: Emergency Medicine

## 2011-01-18 ENCOUNTER — Other Ambulatory Visit: Payer: Self-pay

## 2011-01-18 ENCOUNTER — Encounter (HOSPITAL_COMMUNITY): Payer: Self-pay

## 2011-01-18 DIAGNOSIS — F3289 Other specified depressive episodes: Secondary | ICD-10-CM | POA: Insufficient documentation

## 2011-01-18 DIAGNOSIS — F411 Generalized anxiety disorder: Secondary | ICD-10-CM | POA: Insufficient documentation

## 2011-01-18 DIAGNOSIS — T43294A Poisoning by other antidepressants, undetermined, initial encounter: Secondary | ICD-10-CM | POA: Insufficient documentation

## 2011-01-18 DIAGNOSIS — T43502A Poisoning by unspecified antipsychotics and neuroleptics, intentional self-harm, initial encounter: Secondary | ICD-10-CM | POA: Insufficient documentation

## 2011-01-18 DIAGNOSIS — T50901A Poisoning by unspecified drugs, medicaments and biological substances, accidental (unintentional), initial encounter: Secondary | ICD-10-CM

## 2011-01-18 DIAGNOSIS — F988 Other specified behavioral and emotional disorders with onset usually occurring in childhood and adolescence: Secondary | ICD-10-CM | POA: Insufficient documentation

## 2011-01-18 DIAGNOSIS — F329 Major depressive disorder, single episode, unspecified: Secondary | ICD-10-CM

## 2011-01-18 DIAGNOSIS — T1491XA Suicide attempt, initial encounter: Secondary | ICD-10-CM

## 2011-01-18 HISTORY — DX: Anxiety disorder, unspecified: F41.9

## 2011-01-18 LAB — CBC
HCT: 36.1 % (ref 36.0–46.0)
Hemoglobin: 12.4 g/dL (ref 12.0–15.0)
MCH: 30.5 pg (ref 26.0–34.0)
MCHC: 34.3 g/dL (ref 30.0–36.0)
MCV: 88.9 fL (ref 78.0–100.0)
RBC: 4.06 MIL/uL (ref 3.87–5.11)

## 2011-01-18 LAB — URINALYSIS, ROUTINE W REFLEX MICROSCOPIC
Bilirubin Urine: NEGATIVE
Glucose, UA: NEGATIVE mg/dL
Hgb urine dipstick: NEGATIVE
Protein, ur: NEGATIVE mg/dL

## 2011-01-18 LAB — DIFFERENTIAL
Eosinophils Absolute: 0.4 10*3/uL (ref 0.0–0.7)
Eosinophils Relative: 7 % — ABNORMAL HIGH (ref 0–5)
Lymphs Abs: 2.8 10*3/uL (ref 0.7–4.0)
Monocytes Absolute: 0.5 10*3/uL (ref 0.1–1.0)
Monocytes Relative: 8 % (ref 3–12)
Neutrophils Relative %: 40 % — ABNORMAL LOW (ref 43–77)

## 2011-01-18 LAB — COMPREHENSIVE METABOLIC PANEL
ALT: 6 U/L (ref 0–35)
AST: 12 U/L (ref 0–37)
CO2: 26 mEq/L (ref 19–32)
Chloride: 107 mEq/L (ref 96–112)
Creatinine, Ser: 0.97 mg/dL (ref 0.50–1.10)
GFR calc non Af Amer: 78 mL/min — ABNORMAL LOW (ref >90)
Glucose, Bld: 91 mg/dL (ref 70–99)
Total Bilirubin: 0.2 mg/dL — ABNORMAL LOW (ref 0.3–1.2)

## 2011-01-18 LAB — ETHANOL: Alcohol, Ethyl (B): <11 mg/dL (ref 0–11)

## 2011-01-18 LAB — PREGNANCY, URINE: Preg Test, Ur: NEGATIVE

## 2011-01-18 LAB — RAPID URINE DRUG SCREEN, HOSP PERFORMED
Barbiturates: NOT DETECTED
Cocaine: NOT DETECTED

## 2011-01-18 MED ORDER — ALUM & MAG HYDROXIDE-SIMETH 200-200-20 MG/5ML PO SUSP
30.0000 mL | ORAL | Status: DC | PRN
  Filled 2011-01-18: qty 30

## 2011-01-18 MED ORDER — IBUPROFEN 600 MG PO TABS
600.0000 mg | ORAL_TABLET | Freq: Three times a day (TID) | ORAL | Status: DC | PRN
  Filled 2011-01-18: qty 1

## 2011-01-18 MED ORDER — ACTIDOSE WITH SORBITOL 50 GM/240ML PO LIQD
50.0000 g | Freq: Once | ORAL | Status: AC
  Administered 2011-01-18: 50 g via ORAL
  Filled 2011-01-18 (×2): qty 240

## 2011-01-18 MED ORDER — ZOLPIDEM TARTRATE 5 MG PO TABS: 5.0000 mg | ORAL_TABLET | Freq: Every evening | ORAL | Status: DC | PRN

## 2011-01-18 MED ORDER — ONDANSETRON HCL 4 MG PO TABS
4.0000 mg | ORAL_TABLET | Freq: Three times a day (TID) | ORAL | Status: DC | PRN
  Filled 2011-01-18: qty 1

## 2011-01-18 MED ORDER — ACETAMINOPHEN 325 MG PO TABS
650.0000 mg | ORAL_TABLET | ORAL | Status: DC | PRN
  Filled 2011-01-18: qty 2

## 2011-01-18 MED ORDER — ZIPRASIDONE MESYLATE 20 MG IM SOLR
20.0000 mg | Freq: Once | INTRAMUSCULAR | Status: AC
  Administered 2011-01-18: 20 mg via INTRAMUSCULAR
  Filled 2011-01-18: qty 20

## 2011-01-18 MED ORDER — LORAZEPAM 1 MG PO TABS: 1.0000 mg | ORAL_TABLET | Freq: Three times a day (TID) | ORAL | Status: DC | PRN

## 2011-01-18 MED ORDER — CHARCOAL ACTIVATED PO LIQD
ORAL | Status: AC
  Filled 2011-01-18: qty 240

## 2011-01-18 NOTE — ED Provider Notes (Signed)
History     CSN: [id number]  Arrival date & time 01/18/11  1810   First MD Initiated Contact with Patient 01/18/11 1823      Chief Complaint  Patient presents with  . Drug Overdose    (Consider location/radiation/quality/duration/timing/severity/associated sxs/prior treatment) HPI Comments: Patient ingested 10 15 milligram tablets of mirtazapine. She did this in a suicide attempt to too numerous increasing stressors in her life. She has no homicidal ideation. She's no medical complaints.  Patient is a 30 y.o. female presenting with Overdose. The history is provided by the patient. No language interpreter was used.  Drug Overdose This is a new problem. The current episode started 1 to 2 hours ago. The problem occurs constantly. The problem has been gradually worsening. Pertinent negatives include no chest pain, no abdominal pain, no headaches and no shortness of breath. The symptoms are aggravated by nothing. The symptoms are relieved by nothing. She has tried nothing for the symptoms.    Past Medical History  Diagnosis Date  . Depression   . Attention deficit disorder   . Anxiety     Past Surgical History  Procedure Date  . Tubal ligation     No family history on file.  History  Substance Use Topics  . Smoking status: Current Everyday Smoker  . Smokeless tobacco: Not on file  . Alcohol Use: Yes    OB History    Grav Para Term Preterm Abortions TAB SAB Ect Mult Living                  Review of Systems  Constitutional: Negative for fever, activity change, appetite change and fatigue.  HENT: Negative for congestion, sore throat, rhinorrhea, neck pain and neck stiffness.   Respiratory: Negative for cough and shortness of breath.   Cardiovascular: Negative for chest pain and palpitations.  Gastrointestinal: Negative for nausea, vomiting and abdominal pain.  Genitourinary: Negative for dysuria, urgency, frequency and flank pain.  Neurological: Negative for  dizziness, weakness, light-headedness, numbness and headaches.  Psychiatric/Behavioral: Positive for suicidal ideas and agitation.  All other systems reviewed and are negative.    Allergies  Review of patient's allergies indicates no known allergies.  Home Medications   Current Outpatient Rx  Name Route Sig Dispense Refill  . MIRTAZAPINE 15 MG PO TABS Oral Take 30 mg by mouth at bedtime.      Marland Kitchen DOXYCYCLINE HYCLATE 100 MG PO TABS Oral Take 1 tablet (100 mg total) by mouth 2 (two) times daily. 28 tablet 0  . METHYLPHENIDATE HCL 10 MG PO TABS Oral Take 10 mg by mouth 2 (two) times daily.      Marland Kitchen METRONIDAZOLE 500 MG PO TABS Oral Take 1 tablet (500 mg total) by mouth 2 (two) times daily. 28 tablet 0    BP 141/64  Pulse 97  Temp 99.3 F (37.4 C)  Resp 20  SpO2 100%  Physical Exam  Nursing note and vitals reviewed. Constitutional: She is oriented to person, place, and time. She appears well-developed and well-nourished. No distress.  HENT:  Head: Normocephalic and atraumatic.  Mouth/Throat: Oropharynx is clear and moist.  Eyes: Conjunctivae and EOM are normal. Pupils are equal, round, and reactive to light.  Neck: Normal range of motion. Neck supple.  Cardiovascular: Normal rate, regular rhythm, normal heart sounds and intact distal pulses.  Exam reveals no gallop and no friction rub.   No murmur heard. Pulmonary/Chest: Effort normal and breath sounds normal. No respiratory distress.  Abdominal: Soft. Bowel  sounds are normal. There is no tenderness.  Musculoskeletal: Normal range of motion. She exhibits no tenderness.  Neurological: She is alert and oriented to person, place, and time. No cranial nerve deficit.  Skin: Skin is warm and dry. No rash noted.  Psychiatric: She is agitated. She expresses inappropriate judgment. She exhibits a depressed mood. She expresses suicidal ideation. She expresses no homicidal ideation. She expresses suicidal plans. She expresses no homicidal  plans.    ED Course  Procedures (including critical care time)   Date: 01/18/2011  Rate: 93  Rhythm: normal sinus rhythm  QRS Axis: normal  Intervals: normal  ST/T Wave abnormalities: normal  Conduction Disutrbances:none  Narrative Interpretation:   Old EKG Reviewed: none available  Labs Reviewed  DIFFERENTIAL - Abnormal; Notable for the following:    Neutrophils Relative 40 (*)    Eosinophils Relative 7 (*)    All other components within normal limits  URINALYSIS, ROUTINE W REFLEX MICROSCOPIC - Abnormal; Notable for the following:    APPearance CLOUDY (*)    Leukocytes, UA SMALL (*)    All other components within normal limits  COMPREHENSIVE METABOLIC PANEL - Abnormal; Notable for the following:    Potassium 3.2 (*)    Total Bilirubin 0.2 (*)    GFR calc non Af Amer 78 (*)    All other components within normal limits  URINE MICROSCOPIC-ADD ON - Abnormal; Notable for the following:    Squamous Epithelial / LPF MANY (*)    All other components within normal limits  CBC  PREGNANCY, URINE  URINE RAPID DRUG SCREEN (HOSP PERFORMED)  ETHANOL  ACETAMINOPHEN LEVEL  SALICYLATE LEVEL   No results found.   1. Depression   2. Overdose   3. Suicide attempt       MDM  Patient with overdose on your chest pain. There no significant toxic effects to the dose in which she ingested. Laboratory studies are relatively unremarkable. EKG is normal. She received charcoal however did not consume the entire amount. She became very angry later on in her emergency department visit and required chemical restraint. She was given 20 of Geodon IM. Will await evaluation by ACT team for placement but the patient will require admission given her suicide attempt. At this time she is medically clear for psychiatric evaluation.        Dayton Bailiff, MD 01/18/11 [phone]

## 2011-01-18 NOTE — ED Notes (Signed)
Patient got out of the restraints.   Patient left out at the time. She states she will cooperate and if she does not she is aware she will be placed in restraints again.

## 2011-01-18 NOTE — ED Notes (Signed)
Patient finished drinking charcoal Mother left and left her number.

## 2011-01-18 NOTE — ED Notes (Signed)
AVW:[id number]> Expected date:01/18/11<BR> Expected time: 6:05 PM<BR> Means of arrival:Ambulance<BR> Comments:<BR> EMS 31 GC, 30 yof intentional Overdose

## 2011-01-18 NOTE — ED Notes (Signed)
Producer, television/film/video at bedside. Discussed with pt why she was here and placed under IVC orders. Explained to pt that if she refused to drink charcoal that we would have to restrain her and place an NG tube. Pt acknowledged and decided to drink oral charcoal
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
## 2011-01-18 NOTE — Progress Notes (Signed)
Magistrate Senaida Ores confirms receipt of affidavit for IVC. Original placed in IVC log in Psych ED and 4 copies placed in pt's file.

## 2011-01-18 NOTE — ED Notes (Signed)
Tylenol level due at 0200 which is 4 hours after ingestion of charcoal.

## 2011-01-18 NOTE — ED Notes (Addendum)
Pt intentionally took 10 pills of mirtazapine 15 mg. Reason: depression. Slow at speech, un interactive. See psy assessment above. Poison control called and recommended charcoal 1 gram per kg, ekg, cardiac monitoring, and tylenol level 4 hour post ingestion. Pt ABC intact.

## 2011-01-18 NOTE — ED Notes (Signed)
Per ems pt from home, here for intentional overdose, 10 pills of 15mg  mirtazapine. Pt ambulatory, alert, oriented.
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
## 2011-01-18 NOTE — ED Notes (Signed)
Pt has 1 bag of belongings, stored under nursing station at this moment, the nursing station bedside room 15.

## 2011-01-18 NOTE — ED Notes (Signed)
Pt placed in blue scrub, belongings search, wanded by security.

## 2011-01-19 ENCOUNTER — Inpatient Hospital Stay (HOSPITAL_COMMUNITY)
Admission: AD | Admit: 2011-01-19 | Discharge: 2011-01-21 | DRG: 885 | Disposition: A | Discharge status: Home / Self Care | Payer: Self-pay | Source: Ambulatory Visit | Attending: Psychiatry | Admitting: Psychiatry

## 2011-01-19 ENCOUNTER — Encounter (HOSPITAL_COMMUNITY): Payer: Self-pay | Admitting: *Deleted

## 2011-01-19 DIAGNOSIS — T438X2A Poisoning by other psychotropic drugs, intentional self-harm, initial encounter: Secondary | ICD-10-CM

## 2011-01-19 DIAGNOSIS — F17200 Nicotine dependence, unspecified, uncomplicated: Secondary | ICD-10-CM

## 2011-01-19 DIAGNOSIS — F909 Attention-deficit hyperactivity disorder, unspecified type: Secondary | ICD-10-CM

## 2011-01-19 DIAGNOSIS — F411 Generalized anxiety disorder: Secondary | ICD-10-CM

## 2011-01-19 DIAGNOSIS — Z79899 Other long term (current) drug therapy: Secondary | ICD-10-CM

## 2011-01-19 DIAGNOSIS — G4700 Insomnia, unspecified: Secondary | ICD-10-CM

## 2011-01-19 DIAGNOSIS — T43294A Poisoning by other antidepressants, undetermined, initial encounter: Secondary | ICD-10-CM

## 2011-01-19 DIAGNOSIS — F339 Major depressive disorder, recurrent, unspecified: Secondary | ICD-10-CM | POA: Diagnosis present

## 2011-01-19 DIAGNOSIS — F332 Major depressive disorder, recurrent severe without psychotic features: Principal | ICD-10-CM

## 2011-01-19 DIAGNOSIS — T43502A Poisoning by unspecified antipsychotics and neuroleptics, intentional self-harm, initial encounter: Secondary | ICD-10-CM

## 2011-01-19 DIAGNOSIS — IMO0002 Reserved for concepts with insufficient information to code with codable children: Secondary | ICD-10-CM

## 2011-01-19 LAB — SALICYLATE LEVEL: Salicylate Lvl: <2 mg/dL — ABNORMAL LOW (ref 2.8–20.0)

## 2011-01-19 MED ORDER — ACETAMINOPHEN 325 MG PO TABS
650.0000 mg | ORAL_TABLET | Freq: Four times a day (QID) | ORAL | Status: DC | PRN
  Administered 2011-01-19: 650 mg via ORAL

## 2011-01-19 MED ORDER — METRONIDAZOLE 500 MG PO TABS
500.0000 mg | ORAL_TABLET | Freq: Two times a day (BID) | ORAL | Status: DC
  Administered 2011-01-19 – 2011-01-21 (×4): 500 mg via ORAL
  Filled 2011-01-19 (×8): qty 1

## 2011-01-19 MED ORDER — MAGNESIUM HYDROXIDE 400 MG/5ML PO SUSP: 30.0000 mL | Freq: Every day | ORAL | Status: DC | PRN

## 2011-01-19 MED ORDER — DOXYCYCLINE HYCLATE 100 MG PO TABS
100.0000 mg | ORAL_TABLET | Freq: Two times a day (BID) | ORAL | Status: DC
  Administered 2011-01-19 – 2011-01-21 (×4): 100 mg via ORAL
  Filled 2011-01-19 (×7): qty 1

## 2011-01-19 MED ORDER — ALUM & MAG HYDROXIDE-SIMETH 200-200-20 MG/5ML PO SUSP: 30.0000 mL | ORAL | Status: DC | PRN

## 2011-01-19 MED ORDER — MIRTAZAPINE 30 MG PO TABS
30.0000 mg | ORAL_TABLET | Freq: Every day | ORAL | Status: DC
  Administered 2011-01-19: 30 mg via ORAL
  Filled 2011-01-19 (×3): qty 1

## 2011-01-19 MED ORDER — POTASSIUM CHLORIDE CRYS ER 10 MEQ PO TBCR
10.0000 meq | EXTENDED_RELEASE_TABLET | Freq: Two times a day (BID) | ORAL | Status: AC
  Administered 2011-01-19 – 2011-01-21 (×4): 10 meq via ORAL
  Filled 2011-01-19 (×4): qty 1

## 2011-01-19 NOTE — ED Notes (Signed)
[employer] called for transportation to [facility], IVC papers are correct.

## 2011-01-19 NOTE — Progress Notes (Signed)
Patient ID: [name], female   DOB: Mar 15, 1980, 30 y.o.   MRN: [id number] Patient's first admission to [facility], involuntary.   Has two children with her mother who takes care of foster children.   Two children are in foster care in other homes.   Children are ages 39, 33, 55,4.  Patient took remeron 10 pills yesterday.   Patient's sister called ambulance.   Patient did not want to drink charcoal and was committed to hospital involuntary.  Patient lost her job at [employer] in July 2012, no unemployment, no income.  Legal problems over custody of her children.  Financial problems because she is not working, family member pays for her medications.  Denied SI and HI at admission, contracts for safety.  Denied A/V hallucinations.   Denied pain.  Wants medication to stabilize her moods.   Was taking ritalin for ADD, remeron for sleep, flagyl for UTI.  Patient has been very cooperative and pleasant.   Was oriented to unit, called her family.   Was given food during admission.

## 2011-01-19 NOTE — ED Notes (Signed)
Pt's transportations delayed while IVC papers are being corrected.

## 2011-01-19 NOTE — ED Notes (Signed)
Okay to obtain labs now per RN
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
## 2011-01-19 NOTE — ED Notes (Signed)
Report called to Thayer Ohm, RN at [facility].Edwyna Ready requesting pt. Be sent around 1400.

## 2011-01-19 NOTE — ED Notes (Signed)
Pt. Ambulated without any difficulty out of psych ed escorted by [employer].  [employer] took pt.'s 2 belongings bags with pt. To BH.

## 2011-01-19 NOTE — ED Notes (Signed)
Poison control notified of negative Tylenol and ASA levels. No further action warranted.

## 2011-01-19 NOTE — ED Notes (Signed)
Offered shower to pt. 

## 2011-01-20 DIAGNOSIS — F332 Major depressive disorder, recurrent severe without psychotic features: Principal | ICD-10-CM

## 2011-01-20 LAB — COMPREHENSIVE METABOLIC PANEL
ALT: 6 U/L (ref 0–35)
AST: 14 U/L (ref 0–37)
Alkaline Phosphatase: 46 U/L (ref 39–117)
CO2: 28 mEq/L (ref 19–32)
Chloride: 103 mEq/L (ref 96–112)
Creatinine, Ser: 0.95 mg/dL (ref 0.50–1.10)
GFR calc non Af Amer: 80 mL/min — ABNORMAL LOW (ref >90)
Potassium: 3.4 mEq/L — ABNORMAL LOW (ref 3.5–5.1)
Sodium: 137 mEq/L (ref 135–145)
Total Bilirubin: 0.4 mg/dL (ref 0.3–1.2)

## 2011-01-20 MED ORDER — MIRTAZAPINE 15 MG PO TABS
15.0000 mg | ORAL_TABLET | Freq: Every day | ORAL | Status: DC
  Administered 2011-01-20: 15 mg via ORAL
  Filled 2011-01-20 (×3): qty 1
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
## 2011-01-20 NOTE — Progress Notes (Signed)
[facility] Group Notes:  (Counselor/Nursing/MHT/Case Management/Adjunct)    Type of Therapy:  Group Therapy  Participation Level:  Did Not Attend    [name] 01/20/2011  1:57 PM

## 2011-01-20 NOTE — Tx Team (Signed)
Interdisciplinary Treatment Plan Update (Adult)  Date:  01/20/2011  Time Reviewed:  10:25 AM   Progress in Treatment: Attending groups: Yes Participating in groups:  Yes, although she reports being uncomfortable in group settings and does not want to talk about her experiences Taking medication as prescribed:  Yes Tolerating medication: Yes Family/Significant othe contact made: Counselor assessing for consent to contact sister Patient understands diagnosis: Yes Discussing patient identified problems/goals with staff:  Yes Medical problems stabilized or resolved: Yes Denies suicidal/homicidal ideation: Yes Issues/concerns per patient self-inventory:  No  Other:  New problem(s) identified: None  Reason for Continuation of Hospitalization: Depression Medication stabilization  Interventions implemented related to continuation of hospitalization:  Medication stabilization, safety checks q 15 mins, group attendance  Additional comments: It is believed by treatment team that Eloina is underreporting/minimizing her depression and anxiety, as her affect and speech are non-congruent with depression of 0 and anxiety of 1  Estimated length of stay:  Discharge Plan:  New goal(s):  Review of initial/current patient goals per problem list:   1.  Goal(s):  Decrease depressive symptoms  Met:  Yes  Target date: by discharge  As evidenced by:  Gunnar Fusi rates her depression at 0  today  2.  Goal (s): Reduce potential for self-harm behaviors  Met:  Yes  Target date: by discharge  As evidenced by: Gunnar Fusi reports no SI today  3.  Goal(s): Reduce anxiety symptoms  Met:  Yes  Target date: by discharge  As evidenced by: Gunnar Fusi rates anxiety at  1today  Attendees: Patient:   01/20/2011 10:25 AM  Family:   01/20/2011 10:25 AM  Physician:  Dr Orson Aloe, MD 01/20/2011 10:25 AM  Nursing:   01/20/2011 10:25 AM  Case Manager:  Reyes Ivan, LCSWA 01/20/2011 10:25 AM  Counselor:   Angus Palms, LCSW 01/20/2011 10:25 AM  Other:   01/20/2011 10:25 AM  Other:   01/20/2011 10:25 AM  Other:   01/20/2011 10:25 AM  Other:   01/20/2011 10:25 AM   Scribe for Treatment Team:   [name], 01/20/2011 10:25 AM

## 2011-01-20 NOTE — Progress Notes (Signed)
[facility] Group Notes: (Counselor/Nursing/MHT/Case Management/Adjunct)   Type of Therapy: Group Therapy   Participation Level: Minimal   Participation Quality: Attentive   Affect: Blunted   Cognitive: Appropriate   Insight: None   Engagement in Group: None   Engagement in Therapy: None   Modes of Intervention: Support and Exploration   Summary of Progress/Problems: [name] was attentive but not engaged in group process. When specifically asked, she  did state that depression is the emotion she struggles with most.   [name]  01/20/2011 12:50 PM

## 2011-01-20 NOTE — Progress Notes (Signed)
Pt remains calm, cooperative, but also depressed. Pt states she does not see a way out for her ie: overwhelmed with current situation. Pt denies SI/HI. Going to most groups, tearful at times.

## 2011-01-20 NOTE — Progress Notes (Signed)
Pt. Was confronted about being withdrawn to bed so much  On 3- 11 shift. Pt. Stated that she did not know there was a PM group. Pt. Was encouraged to become more vested in the program & plan to attend all group therapy. Affect is flat & mood sad.Pt. Denies SI,HI, & AVH. Continues on 15 minute checks. Pt. Safety maintained.
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
## 2011-01-20 NOTE — Progress Notes (Signed)
Patient ID: [name], female   DOB: 1980-05-15, 30 y.o.   MRN: [id number]  Patient asleep in bed, resting with eyes closed. Appears in no distress. Respirations even, normal and unlabored. Will continue monitoring q15 minutes.
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
## 2011-01-21 DIAGNOSIS — F329 Major depressive disorder, single episode, unspecified: Secondary | ICD-10-CM

## 2011-01-21 LAB — TSH: TSH: 3.895 u[IU]/mL (ref 0.350–4.500)

## 2011-01-21 LAB — T3, FREE: T3, Free: 3.2 pg/mL (ref 2.3–4.2)

## 2011-01-21 LAB — T4, FREE: Free T4: 0.99 ng/dL (ref 0.80–1.80)

## 2011-01-21 NOTE — Progress Notes (Signed)
Suicide Risk Assessment  Discharge Assessment     Demographic factors:  Assessment Details Time of Assessment: Discharge Information Obtained From: Patient Current Mental Status:  Current Mental Status: Belief that plan would result in death (denied SI & HI, believes that any plan could result in harm/) Risk Reduction Factors:  Risk Reduction Factors: Responsible for children under 29 years of age;Sense of responsibility to family;Positive social support  CLINICAL FACTORS:   Severe Anxiety and/or Agitation Dysthymia Previous Psychiatric Diagnoses and Treatments Medical Diagnoses and Treatments/Surgeries  COGNITIVE FEATURES THAT CONTRIBUTE TO RISK:  Thought constriction (tunnel vision)    SUICIDE RISK:   Minimal: No identifiable suicidal ideation.  Patients presenting with no risk factors but with morbid ruminations; may be classified as minimal risk based on the severity of the depressive symptoms  Patient denies suicidal or homicidal ideation, hallucinations, illusions, or delusions. Patient engages with good eye contact, is able to focus adequately in a one to one setting, and has clear goal directed thoughts. Patient speaks with a natural conversational volume, rate, and tone. Anxiety was reported at 1 on a scale of 1 the least and 10 the most. Depression was reported at 1 on the same scale. Patient is oriented times 4, recent and remote memory intact. Judgement: Improved from admission Insight: Has learned how to handle some of her stress and that group is not that bad. Plan: Remain on . doxycycline  100 mg Oral BID  . metroNIDAZOLE  500 mg Oral BID  . mirtazapine  15 mg Oral QHS  . potassium chloride  10 mEq Oral BID  . DISCONTD: mirtazapine  30 mg Oral QHS  Because Doxycycline and Metronidazole are for infection, Remeron is for depression and insomnia, and Potassium is for replacement Pt voices understanding of the risks and benefits of medication. Follow-up is  with Follow-up Information    Follow up with [facility] - Therapist French Ana on 02/10/2011. (Appointment scheduled at 2:00 pm)    Contact information:   [address], [place] [address] [[phone]      Follow up with [facility] - Dr. Ladona Ridgel on 02/12/2011. (Appointment scheduled at 8:30 am)    Contact information:   [address] [place] [address] [phone]       [place], Sol Odor 01/21/2011, 2:28 PM

## 2011-01-21 NOTE — Tx Team (Signed)
Interdisciplinary Treatment Plan Update (Adult)  Date:  01/21/2011  Time Reviewed: 2:51 PM   Progress in Treatment: Attending groups: Yes Participating in groups:  Yes Taking medication as prescribed: Yes Tolerating medication:  Yes Family/Significant othe contact made: yes  Patient understands diagnosis:  Yes Discussing patient identified problems/goals with staff:  Yes Medical problems stabilized or resolved:  Yes Denies suicidal/homicidal ideation: Yes Issues/concerns per patient self-inventory:  None identified Other:  New problem(s) identified: none  Reason for Continuation of Hospitalization: Pt stable and ready for discharge  Additional comments: none  Estimated length of stay: 0  Discharge Plan: [name] is stable and ready for discharge today, pt will follow up with [facility] for medication management on Jan 18th at 3:30 with Dr. Ladona Ridgel and will follow with her therapist French Ana at [facility] on Jan 16th at 2:00  New goal(s): None  Review of initial/current patient goals per problem list:   1.  Goal(s): Pt able to report decrease in depression  Met:  yes  Target date: discharge  As evidenced by: Pt reporting depression level at a 3 or lower  2.  Goal (s): pt will report a decrease in anxiety level  Met:  yes  Target date: discharge  As evidenced by: Pt reports anxiety level below 3  3.  Goal(s):  Met:    Target date:  As evidenced by:  4.  Goal(s):  Met:    Target date:  As evidenced by:    Attendees: Patient:  [name] 01/21/2011 2:51   Family:     Physician:  Orson Aloe, MD 01/21/2011 2:51 PM   Nursing:   Quintella Reichert, RN 01/21/2011 2:51 PM   Case Manager:  Vanetta Mulders, LPCA 01/21/2011 2:51 PM   Counselor:  Angus Palms, LSCW 01/21/2011 2:51 PM   Other:     Other:     Other:     Other:      Scribe for Treatment Team:   Purcell Nails, LPCA 01/21/2011 2:51 PM

## 2011-01-21 NOTE — Discharge Summary (Signed)
01/21/2011                                 Discharge Note Pt attended aftercare group and treatment team, pt will discharge today and plans to go back to apartment and is able to secure rides from family members and has follow up appointments in [place] at [facility] - pt will see her therapist Tracy Jan. 16th at 2:00 and has an appt. For med management Jan 18th at 8:30. Aunisty denies SI/HI and reports no barriers for obtaining medications.Pt rated both her depression and anxiety levels at 1. Pt had bright affect and ready to go home. Bryndon Cumbie, LPCA
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
## 2011-01-21 NOTE — Progress Notes (Addendum)
Patient's self inventory sheet, sleeps fair, has good appetite,  normal  energy level, improving attention span.  Denied SI.  Rated depression and hopelessness as #1.  Plans to stick with therapy and med management after discharge.  Is aware of discharge plans.  No problems taking meds after discharge.    Patient signed voluntary admission form this morning.

## 2011-01-21 NOTE — Discharge Summary (Signed)
Discharge Note  Patient:  [name] is an 30 y.o., female DOB:  30-Jun-1980  Date of Admission:  01/19/2011  Date of Discharge:  01/21/2011  Level of Care:  OP  Discharge destination:  HOME  Is patient on multiple antipsychotic therapies at discharge:  NO  Patient phone:  [phone] (home) Patient address:   [address] [place] [address]  The patient received suicide prevention pamphlet:  YES Belongings returned:  Valuables  Dan Humphreys, Ebba Goll 01/21/2011,2:40 PM

## 2011-01-21 NOTE — Progress Notes (Signed)
Discharge Note:   Patient denied SI and HI.   Denied A/V hallucinations.   Denied pain.   Suicide prevention information given to patient, discussed with patient, who stated she understood and has no questions.   Patient received all her clothing, belongings, black shoe, black coat, medication remeron, all discharge instructions.   Stated she understands all her discharge instructions, and has no questions.   Stated she appreciated all the staff has done to assist her while at [facility].   Patient has been cooperative and pleasant today.

## 2011-01-21 NOTE — Progress Notes (Addendum)
Patient has been cooperative and pleasant.
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
## 2011-01-21 NOTE — Progress Notes (Signed)
Patient ID: [name], female   DOB: 1980/05/14, 30 y.o.   MRN: [id number] Pt. Lying down, eyes closed, resp. Even, no distress noted. Staff will continue to monitor q75min for safety.

## 2011-01-21 NOTE — Progress Notes (Signed)
[facility] Adult Inpatient Family/Significant Other Suicide Prevention Education  Suicide Prevention Education:  Education Completed; [name], sister has been identified by the patient as the family member/significant other with whom the patient will be residing, and identified as the person(s) who will aid the patient in the event of a mental health crisis (suicidal ideations/suicide attempt).  With written consent from the patient, the family member/significant other has been provided the following suicide prevention education, prior to the and/or following the discharge of the patient.  The suicide prevention education provided includes the following:  Suicide risk factors  Suicide prevention and interventions  National Suicide Hotline telephone number  [facility] assessment telephone number  [facility] Emergency Assistance 911  [facility] and/or Residential Mobile Crisis Unit telephone number  Request made of family/significant other to:  Remove weapons (e.g., guns, rifles, knives), all items previously/currently identified as safety concern.    Remove drugs/medications (over-the-counter, prescriptions, illicit drugs), all items previously/currently identified as a safety concern.  [name] reported that she does not believe [name] is a danger to herself or others anymore, as she seems more like herself. She stated that [name] does not have access to weapons and that the home will be secured of medications before [name] returns. [name] verbalized understanding of suicide prevention education information, and did not have any questions. She asked about follow up support and was informed that [name] has aftercare appointments to follow up on therapy and medications.  [name] 01/21/2011, 12:42 PM

## 2011-01-22 ENCOUNTER — Encounter (HOSPITAL_COMMUNITY): Payer: Self-pay | Admitting: Emergency Medicine

## 2011-01-22 NOTE — Progress Notes (Signed)
Patient Discharge Instructions:  Admission Note Faxed,  01/22/2011 Discharge Note Faxed,   01/22/2011 After Visit Summary Faxed,  01/22/2011 Faxed to the Next Level Care provider:  01/22/2011 D/C Summary faxed 01/22/2011 Facesheet faxed 01/22/2011  Faxed to Dewayne Shorter and Dr. Ladona Ridgel @ [phone]  Wandra Scot, 01/22/2011, 3:30 PM

## 2012-07-17 ENCOUNTER — Emergency Department (HOSPITAL_COMMUNITY)
Admission: EM | Admit: 2012-07-17 | Discharge: 2012-07-17 | Disposition: A | Discharge status: Home / Self Care | Payer: Self-pay | Attending: Emergency Medicine | Admitting: Emergency Medicine

## 2012-07-17 ENCOUNTER — Encounter (HOSPITAL_COMMUNITY): Payer: Self-pay | Admitting: *Deleted

## 2012-07-17 DIAGNOSIS — F3289 Other specified depressive episodes: Secondary | ICD-10-CM | POA: Insufficient documentation

## 2012-07-17 DIAGNOSIS — Y990 Civilian activity done for income or pay: Secondary | ICD-10-CM | POA: Insufficient documentation

## 2012-07-17 DIAGNOSIS — S43499A Other sprain of unspecified shoulder joint, initial encounter: Secondary | ICD-10-CM | POA: Insufficient documentation

## 2012-07-17 DIAGNOSIS — F411 Generalized anxiety disorder: Secondary | ICD-10-CM | POA: Insufficient documentation

## 2012-07-17 DIAGNOSIS — K0889 Other specified disorders of teeth and supporting structures: Secondary | ICD-10-CM

## 2012-07-17 DIAGNOSIS — F329 Major depressive disorder, single episode, unspecified: Secondary | ICD-10-CM | POA: Insufficient documentation

## 2012-07-17 DIAGNOSIS — S46811A Strain of other muscles, fascia and tendons at shoulder and upper arm level, right arm, initial encounter: Secondary | ICD-10-CM

## 2012-07-17 DIAGNOSIS — Z79899 Other long term (current) drug therapy: Secondary | ICD-10-CM | POA: Insufficient documentation

## 2012-07-17 DIAGNOSIS — K089 Disorder of teeth and supporting structures, unspecified: Secondary | ICD-10-CM | POA: Insufficient documentation

## 2012-07-17 DIAGNOSIS — F988 Other specified behavioral and emotional disorders with onset usually occurring in childhood and adolescence: Secondary | ICD-10-CM | POA: Insufficient documentation

## 2012-07-17 DIAGNOSIS — X503XXA Overexertion from repetitive movements, initial encounter: Secondary | ICD-10-CM | POA: Insufficient documentation

## 2012-07-17 DIAGNOSIS — F17200 Nicotine dependence, unspecified, uncomplicated: Secondary | ICD-10-CM | POA: Insufficient documentation

## 2012-07-17 DIAGNOSIS — Y929 Unspecified place or not applicable: Secondary | ICD-10-CM | POA: Insufficient documentation

## 2012-07-17 MED ORDER — PENICILLIN V POTASSIUM 250 MG PO TABS
500.0000 mg | ORAL_TABLET | Freq: Once | ORAL | Status: AC
  Administered 2012-07-17: 500 mg via ORAL
  Filled 2012-07-17: qty 2

## 2012-07-17 MED ORDER — TRAMADOL HCL 50 MG PO TABS
50.0000 mg | ORAL_TABLET | Freq: Once | ORAL | Status: AC
  Administered 2012-07-17: 50 mg via ORAL
  Filled 2012-07-17: qty 1

## 2012-07-17 MED ORDER — NAPROXEN 500 MG PO TABS: 500.0000 mg | ORAL_TABLET | Freq: Two times a day (BID) | ORAL | Status: DC

## 2012-07-17 MED ORDER — TRAMADOL HCL 50 MG PO TABS: 50.0000 mg | ORAL_TABLET | Freq: Four times a day (QID) | ORAL | Status: DC | PRN

## 2012-07-17 MED ORDER — PENICILLIN V POTASSIUM 500 MG PO TABS: 500.0000 mg | ORAL_TABLET | Freq: Four times a day (QID) | ORAL | Status: DC

## 2012-07-17 NOTE — ED Notes (Signed)
Called pt in waiting room 3 times no answer brought back next pt
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
## 2012-07-17 NOTE — ED Provider Notes (Signed)
History     CSN: [id number]  Arrival date & time 07/17/12  1102   First MD Initiated Contact with Patient 07/17/12 1244      No chief complaint on file.   (Consider location/radiation/quality/duration/timing/severity/associated sxs/prior treatment) HPI Comments: The patient is a 32 year old otherwise healthy female who presents with dental pain that started gradually one month ago. The dental pain is severe, constant and progressively worsening. The pain is aching and located in bilateral lower jaw. The pain does not radiate. Eating makes the pain worse. Nothing makes the pain better. The patient has not tried anything for pain. No associated symptoms. Patient denies headache, neck pain/stiffness, fever, NVD, edema, sore throat, throat swelling, wheezing, SOB, chest pain, abdominal pain.   Patient also complains of right shoulder pain that is exacerbated when pulling carts at work. The pain is aching and severe without radiation. No associated symptoms. Movement makes the pain worse. Nothing makes the pain better. Patient has not tried anything for symptoms.    Past Medical History  Diagnosis Date  . Depression   . Attention deficit disorder   . Anxiety     Past Surgical History  Procedure Laterality Date  . Tubal ligation      No family history on file.  History  Substance Use Topics  . Smoking status: Current Every Day Smoker -- 1.00 packs/day for 4 years    Types: Cigarettes  . Smokeless tobacco: Not on file  . Alcohol Use: No    OB History   Grav Para Term Preterm Abortions TAB SAB Ect Mult Living                  Review of Systems  HENT: Positive for dental problem.   Musculoskeletal: Positive for arthralgias.  All other systems reviewed and are negative.    Allergies  Review of patient's allergies indicates no known allergies.  Home Medications   Current Outpatient Rx  Name  Route  Sig  Dispense  Refill  . mirtazapine (REMERON) 15 MG tablet   Oral    Take 30 mg by mouth at bedtime.           . mirtazapine (REMERON) 15 MG tablet   Oral   Take 15 mg by mouth at bedtime.             BP 106/71  Pulse 74  Temp(Src) 98.1 F (36.7 C) (Oral)  Resp 18  SpO2 100%  LMP 06/29/2012  Physical Exam  Nursing note and vitals reviewed. Constitutional: She is oriented to person, place, and time. She appears well-developed and well-nourished. No distress.  HENT:  Head: Normocephalic and atraumatic.  Mouth/Throat: Oropharynx is clear and moist. No oropharyngeal exudate.  Poor dentition. Tenderness to percussion of bilateral lower posterior molars.   Eyes: Conjunctivae are normal.  Neck: Normal range of motion.  Cardiovascular: Normal rate, regular rhythm and intact distal pulses.  Exam reveals no gallop and no friction rub.   No murmur heard. Pulmonary/Chest: Effort normal and breath sounds normal. She has no wheezes. She has no rales. She exhibits no tenderness.  Abdominal: Soft. She exhibits no distension. There is no tenderness. There is no rebound.  Musculoskeletal: Normal range of motion.  Tenderness to palpation over right proximal trapezius muscle. Full ROM of right shoulder. No obvious deformity.   Neurological: She is alert and oriented to person, place, and time. Coordination normal.  Speech is goal-oriented. Moves limbs without ataxia.   Skin: Skin is warm  and dry.  Psychiatric: She has a normal mood and affect. Her behavior is normal.    ED Course  Procedures (including critical care time)  Labs Reviewed - No data to display No results found.   1. Strain of trapezius muscle, right, initial encounter   2. Pain, dental       MDM  12:54 PM Patient likely has muscle strain injury to right trapezius muscle. Patient will have tramadol for dental pain and shoulder pain as well as veetid for possible dental infection. Patient will have resource guide to follow up with PCP and dentist. No neurvascular compromise of arm or  obvious deformity. Vitals stable and patient afebrile. Patient instructed to return with worsening or concerning symptoms.        Emilia Beck, PA-C 07/17/12 1303

## 2012-07-18 NOTE — ED Provider Notes (Signed)
Medical screening examination/treatment/procedure(s) were performed by non-physician practitioner and as supervising physician I was immediately available for consultation/collaboration.   Gavin Pound. Katrese Shell, MD 07/18/12 1034

## 2012-09-30 ENCOUNTER — Emergency Department (HOSPITAL_COMMUNITY)
Admission: EM | Admit: 2012-09-30 | Discharge: 2012-09-30 | Disposition: A | Discharge status: Home / Self Care | Payer: Self-pay | Attending: Emergency Medicine | Admitting: Emergency Medicine

## 2012-09-30 ENCOUNTER — Encounter (HOSPITAL_COMMUNITY): Payer: Self-pay | Admitting: Emergency Medicine

## 2012-09-30 DIAGNOSIS — K089 Disorder of teeth and supporting structures, unspecified: Secondary | ICD-10-CM | POA: Insufficient documentation

## 2012-09-30 DIAGNOSIS — Z8659 Personal history of other mental and behavioral disorders: Secondary | ICD-10-CM | POA: Insufficient documentation

## 2012-09-30 DIAGNOSIS — F17200 Nicotine dependence, unspecified, uncomplicated: Secondary | ICD-10-CM | POA: Insufficient documentation

## 2012-09-30 DIAGNOSIS — K0889 Other specified disorders of teeth and supporting structures: Secondary | ICD-10-CM

## 2012-09-30 LAB — COMPREHENSIVE METABOLIC PANEL
AST: 11 U/L (ref 0–37)
CO2: 27 mEq/L (ref 19–32)
Calcium: 9.3 mg/dL (ref 8.4–10.5)
Chloride: 103 mEq/L (ref 96–112)
Creatinine, Ser: 0.98 mg/dL (ref 0.50–1.10)
GFR calc Af Amer: 88 mL/min — ABNORMAL LOW (ref >90)
GFR calc non Af Amer: 76 mL/min — ABNORMAL LOW (ref >90)
Glucose, Bld: 93 mg/dL (ref 70–99)
Total Bilirubin: 0.4 mg/dL (ref 0.3–1.2)

## 2012-09-30 MED ORDER — PENICILLIN V POTASSIUM 500 MG PO TABS: 500.0000 mg | ORAL_TABLET | Freq: Four times a day (QID) | ORAL | Status: AC

## 2012-09-30 MED ORDER — TRAMADOL HCL 50 MG PO TABS: 50.0000 mg | ORAL_TABLET | Freq: Four times a day (QID) | ORAL | Status: DC | PRN

## 2012-09-30 NOTE — ED Provider Notes (Signed)
Medical screening examination/treatment/procedure(s) were performed by non-physician practitioner and as supervising physician I was immediately available for consultation/collaboration.    Gilda Crease, MD 09/30/12 [phone]

## 2012-09-30 NOTE — ED Provider Notes (Signed)
CSN: [id number]     Arrival date & time 09/30/12  1359 History   First MD Initiated Contact with Patient 09/30/12 1411     Chief Complaint  Patient presents with  . Dental Pain   (Consider location/radiation/quality/duration/timing/severity/associated sxs/prior Treatment) HPI Comments: Patient per she's had gradually worsening right lower dental pain for the past several months. States initially it was intermittent it has been constant for the past 2-3 months. She was seen at [facility] emergency department one month ago. States she is having trouble controlling the pain has been taking a lot of Tylenol and Aleve. States that she does not know how many Tylenol she is taking but has taken 6 so far today. States she took 24 Aleve over the one day time frame a few days ago denies fever, chills, sore throat, difficulty swallowing or breathing, swelling in her face. She has not seen a dentist  The history is provided by the patient.    Past Medical History  Diagnosis Date  . Depression   . Attention deficit disorder   . Anxiety    Past Surgical History  Procedure Laterality Date  . Tubal ligation     No family history on file. History  Substance Use Topics  . Smoking status: Current Every Day Smoker -- 1.00 packs/day for 4 years    Types: Cigarettes  . Smokeless tobacco: Not on file  . Alcohol Use: No   OB History   Grav Para Term Preterm Abortions TAB SAB Ect Mult Living                 Review of Systems  Constitutional: Negative for fever and chills.  HENT: Positive for dental problem. Negative for sore throat, drooling and trouble swallowing.   Respiratory: Negative for shortness of breath.     Allergies  Review of patient's allergies indicates no known allergies.  Home Medications   Current Outpatient Rx  Name  Route  Sig  Dispense  Refill  . acetaminophen (TYLENOL) 500 MG tablet   Oral   Take 1,000 mg by mouth every 6 (six) hours as needed for pain.          BP  108/68  Pulse 73  Temp(Src) 98.5 F (36.9 C) (Oral)  Resp 12  SpO2 100% Physical Exam  Nursing note and vitals reviewed. Constitutional: She appears well-developed and well-nourished. No distress.  HENT:  Head: Normocephalic and atraumatic.  Mouth/Throat: Oropharynx is clear and moist.    Neck: Normal range of motion. Neck supple.  Pulmonary/Chest: Effort normal. No stridor.  Lymphadenopathy:    She has no cervical adenopathy.  Neurological: She is alert.  Skin: She is not diaphoretic.    ED Course  Procedures (including critical care time) Labs Review Labs Reviewed  COMPREHENSIVE METABOLIC PANEL - Abnormal; Notable for the following:    Alkaline Phosphatase 37 (*)    GFR calc non Af Amer 76 (*)    GFR calc Af Amer 88 (*)    All other components within normal limits  SALICYLATE LEVEL - Abnormal; Notable for the following:    Salicylate Lvl <2.0 (*)    All other components within normal limits  ACETAMINOPHEN LEVEL   Imaging Review No results found.  MDM   1. Pain, dental    Patient with chronic ongoing dental pain that has gotten gradually worse. She does not have any obvious abscess but I will treat her for possible underlying abscess given the increase in her pain. Patient has  been over taking over-the-counter medications at home. I have discussed with her the risks of this and we have checked her acetaminophen and salicylate levels and a comprehensive metabolic panel that are unremarkable and do not indicate need for overdose treatment.  Pt advised to use medications appropriately.  Discussed result, findings, treatment, and follow up  with patient.  Pt given return precautions.  Pt verbalizes understanding and agrees with plan.        Trixie Dredge, PA-C 09/30/12 1538

## 2012-09-30 NOTE — ED Notes (Signed)
Pt c/o dental pain x 2 months 

## 2012-12-26 ENCOUNTER — Encounter (HOSPITAL_COMMUNITY): Payer: Self-pay | Admitting: Emergency Medicine

## 2012-12-26 ENCOUNTER — Emergency Department (HOSPITAL_COMMUNITY)
Admission: EM | Admit: 2012-12-26 | Discharge: 2012-12-26 | Disposition: A | Discharge status: Home / Self Care | Payer: No Typology Code available for payment source | Attending: Emergency Medicine | Admitting: Emergency Medicine

## 2012-12-26 DIAGNOSIS — Z8659 Personal history of other mental and behavioral disorders: Secondary | ICD-10-CM | POA: Insufficient documentation

## 2012-12-26 DIAGNOSIS — F17200 Nicotine dependence, unspecified, uncomplicated: Secondary | ICD-10-CM | POA: Insufficient documentation

## 2012-12-26 DIAGNOSIS — N898 Other specified noninflammatory disorders of vagina: Secondary | ICD-10-CM | POA: Insufficient documentation

## 2012-12-26 DIAGNOSIS — K5900 Constipation, unspecified: Secondary | ICD-10-CM | POA: Insufficient documentation

## 2012-12-26 DIAGNOSIS — R112 Nausea with vomiting, unspecified: Secondary | ICD-10-CM | POA: Insufficient documentation

## 2012-12-26 DIAGNOSIS — R109 Unspecified abdominal pain: Secondary | ICD-10-CM | POA: Insufficient documentation

## 2012-12-26 DIAGNOSIS — R000 Tachycardia, unspecified: Secondary | ICD-10-CM | POA: Insufficient documentation

## 2012-12-26 DIAGNOSIS — Z3202 Encounter for pregnancy test, result negative: Secondary | ICD-10-CM | POA: Insufficient documentation

## 2012-12-26 DIAGNOSIS — K625 Hemorrhage of anus and rectum: Secondary | ICD-10-CM

## 2012-12-26 LAB — COMPREHENSIVE METABOLIC PANEL
ALT: 6 U/L (ref 0–35)
AST: 12 U/L (ref 0–37)
Albumin: 4.1 g/dL (ref 3.5–5.2)
Alkaline Phosphatase: 45 U/L (ref 39–117)
Calcium: 9.3 mg/dL (ref 8.4–10.5)
Potassium: 3.3 mEq/L — ABNORMAL LOW (ref 3.5–5.1)
Sodium: 135 mEq/L (ref 135–145)
Total Protein: 7.2 g/dL (ref 6.0–8.3)

## 2012-12-26 LAB — CBC WITH DIFFERENTIAL/PLATELET
Basophils Absolute: 0 10*3/uL (ref 0.0–0.1)
Eosinophils Absolute: 0.4 10*3/uL (ref 0.0–0.7)
Eosinophils Relative: 8 % — ABNORMAL HIGH (ref 0–5)
Lymphocytes Relative: 48 % — ABNORMAL HIGH (ref 12–46)
Lymphs Abs: 2.3 10*3/uL (ref 0.7–4.0)
MCH: 31.7 pg (ref 26.0–34.0)
MCV: 91.9 fL (ref 78.0–100.0)
Neutrophils Relative %: 36 % — ABNORMAL LOW (ref 43–77)
Platelets: 270 10*3/uL (ref 150–400)
RBC: 3.94 MIL/uL (ref 3.87–5.11)
RDW: 13.1 % (ref 11.5–15.5)
WBC: 4.7 10*3/uL (ref 4.0–10.5)

## 2012-12-26 LAB — URINALYSIS, ROUTINE W REFLEX MICROSCOPIC
Bilirubin Urine: NEGATIVE
Glucose, UA: NEGATIVE mg/dL
Ketones, ur: NEGATIVE mg/dL
Nitrite: NEGATIVE
Specific Gravity, Urine: 1.016 (ref 1.005–1.030)
pH: 6 (ref 5.0–8.0)

## 2012-12-26 LAB — URINE MICROSCOPIC-ADD ON

## 2012-12-26 LAB — POCT PREGNANCY, URINE: Preg Test, Ur: NEGATIVE

## 2012-12-26 NOTE — ED Notes (Addendum)
Pt c/o vomiting, abd pain and also states when she has a bowel movement it is very painful. States she sees bright red blood when she has a BM in the stool and on tissue. Pt also states she is having vaginal bleeding. States all this started around Friday. Family hx of colon cancer per pt.

## 2012-12-26 NOTE — ED Notes (Signed)
In addition to below note, pt states she has Hx of colon cancer and has begun seeing blood in her emesis starting last week.

## 2012-12-26 NOTE — Progress Notes (Signed)
[facility] provided pt with a [facility] [employer] Card application and a list of primary care resources.  °

## 2012-12-26 NOTE — ED Notes (Signed)
Hemo-cult  result given to Dr Jeraldine Loots [facility]

## 2012-12-26 NOTE — ED Provider Notes (Signed)
CSN: [id number]     Arrival date & time 12/26/12  4098 History   First MD Initiated Contact with Patient 12/26/12 1005     Chief Complaint  Patient presents with  . Emesis  . Rectal Bleeding    HPI  Patient presents with symptoms that has become worse over the past months, but began approximately one year ago.  She notes over this time she has had persistent constipation, with decreasing the frequent bowel movements.  With bowel movements she now has blood on her stool, and on the tissue paper.  There is no rectal bleeding and is not associated with bowel movements. There is frequent tenesmus.  No ongoing lightheadedness, syncope, nausea, vomiting, fevers, chills. Patient has not seen a physician over the year. She has a family history of colon cancer.  She has no prior colonoscopies.   Past Medical History  Diagnosis Date  . Depression   . Attention deficit disorder   . Anxiety    Past Surgical History  Procedure Laterality Date  . Tubal ligation     No family history on file. History  Substance Use Topics  . Smoking status: Current Every Day Smoker -- 1.00 packs/day for 4 years    Types: Cigarettes  . Smokeless tobacco: Not on file  . Alcohol Use: No   OB History   Grav Para Term Preterm Abortions TAB SAB Ect Mult Living                 Review of Systems  Constitutional:       Per HPI, otherwise negative  HENT:       Per HPI, otherwise negative  Respiratory:       Per HPI, otherwise negative  Cardiovascular:       Per HPI, otherwise negative  Gastrointestinal: Positive for nausea, vomiting, abdominal pain, constipation, blood in stool and anal bleeding. Negative for abdominal distention.  Endocrine:       Negative aside from HPI  Genitourinary: Positive for vaginal bleeding.  Musculoskeletal:       Per HPI, otherwise negative  Skin: Negative for pallor and rash.  Neurological: Negative for syncope.    Allergies  Review of patient's allergies indicates no  known allergies.  Home Medications  No current outpatient prescriptions on file. BP 140/91  Pulse 108  Temp(Src) 98.5 F (36.9 C) (Oral)  Resp 20  SpO2 100%  LMP 12/11/2012 Physical Exam  Nursing note and vitals reviewed. Constitutional: She is oriented to person, place, and time. She appears well-developed and well-nourished. No distress.  HENT:  Head: Normocephalic and atraumatic.  Eyes: Conjunctivae and EOM are normal.  Cardiovascular: Regular rhythm.  Tachycardia present.   Pulmonary/Chest: Effort normal and breath sounds normal. No stridor. No respiratory distress.  Abdominal: She exhibits no distension.  Genitourinary: Guaiac positive stool.     Musculoskeletal: She exhibits no edema.  Neurological: She is alert and oriented to person, place, and time. No cranial nerve deficit.  Skin: Skin is warm and dry.  Psychiatric: She has a normal mood and affect.    ED Course  Procedures (including critical care time) Labs Review Labs Reviewed  CBC WITH DIFFERENTIAL - Abnormal; Notable for the following:    Neutrophils Relative % 36 (*)    Lymphocytes Relative 48 (*)    Eosinophils Relative 8 (*)    All other components within normal limits  URINALYSIS, ROUTINE W REFLEX MICROSCOPIC - Abnormal; Notable for the following:    Hgb urine dipstick  LARGE (*)    Leukocytes, UA SMALL (*)    All other components within normal limits  OCCULT BLOOD, POC DEVICE - Abnormal; Notable for the following:    Fecal Occult Bld POSITIVE (*)    All other components within normal limits  URINE MICROSCOPIC-ADD ON  COMPREHENSIVE METABOLIC PANEL  LIPASE, BLOOD  POCT PREGNANCY, URINE   Imaging Review No results found.  EKG Interpretation   None       12:44 PM On repeat exam the patient is upright, in no distress.  I discussed findings with her, stressed the need for outpatient management, specifically colonoscopy, primary care followup.  MDM  No diagnosis found. This patient presents  with rectal bleeding.  On exam she is awake and alert, in no distress.  She is a soft abdomen.  The patient's description of infrequent bowel movements blood with stool, there is concern for constipation. Patient has no lightheadedness, syncope, chest pain, dyspnea suggestive of significant anemia.  Labs are reassuring.  Patient remained in no distress, was upright, and Lipitor, speaking clearly on repeat exam.  She was appropriate for discharge with outpatient management    Gerhard Munch, MD 12/26/12 1245

## 2012-12-29 ENCOUNTER — Ambulatory Visit: Payer: Self-pay | Attending: Internal Medicine | Admitting: Internal Medicine

## 2012-12-29 ENCOUNTER — Encounter: Payer: Self-pay | Admitting: Internal Medicine

## 2012-12-29 VITALS — BP 136/88 | HR 68 | Temp 98.7°F | Resp 17 | Wt 139.0 lb

## 2012-12-29 DIAGNOSIS — N920 Excessive and frequent menstruation with regular cycle: Secondary | ICD-10-CM | POA: Insufficient documentation

## 2012-12-29 DIAGNOSIS — K5909 Other constipation: Secondary | ICD-10-CM

## 2012-12-29 DIAGNOSIS — R634 Abnormal weight loss: Secondary | ICD-10-CM | POA: Insufficient documentation

## 2012-12-29 DIAGNOSIS — K5900 Constipation, unspecified: Secondary | ICD-10-CM | POA: Insufficient documentation

## 2012-12-29 DIAGNOSIS — R1013 Epigastric pain: Secondary | ICD-10-CM

## 2012-12-29 DIAGNOSIS — K3189 Other diseases of stomach and duodenum: Secondary | ICD-10-CM

## 2012-12-29 DIAGNOSIS — Z139 Encounter for screening, unspecified: Secondary | ICD-10-CM

## 2012-12-29 DIAGNOSIS — R109 Unspecified abdominal pain: Secondary | ICD-10-CM | POA: Insufficient documentation

## 2012-12-29 LAB — COMPLETE METABOLIC PANEL WITH GFR
AST: 13 U/L (ref 0–37)
BUN: 11 mg/dL (ref 6–23)
Calcium: 9.6 mg/dL (ref 8.4–10.5)
Chloride: 103 mEq/L (ref 96–112)
Creat: 0.85 mg/dL (ref 0.50–1.10)
Total Bilirubin: 0.4 mg/dL (ref 0.3–1.2)

## 2012-12-29 LAB — TSH: TSH: 1.849 u[IU]/mL (ref 0.350–4.500)

## 2012-12-29 LAB — LIPID PANEL
Cholesterol: 208 mg/dL — ABNORMAL HIGH (ref 0–200)
Total CHOL/HDL Ratio: 4.5 Ratio
Triglycerides: 75 mg/dL (ref <150)

## 2012-12-29 MED ORDER — OMEPRAZOLE 40 MG PO CPDR: 40.0000 mg | DELAYED_RELEASE_CAPSULE | Freq: Every day | ORAL | Status: DC

## 2012-12-29 MED ORDER — MAGNESIUM HYDROXIDE 400 MG/5ML PO SUSP: 5.0000 mL | Freq: Every day | ORAL | Status: DC | PRN

## 2012-12-29 NOTE — Progress Notes (Signed)
MRN: [id number] Name: [name]  Sex: female Age: 32 y.o. DOB: [birth year]  Allergies: Review of patient's allergies indicates no known allergies.  Chief Complaint  Patient presents with  . Establish Care  . Abdominal Pain    HPI: Patient is 32 y.o. female who comes today to establish medical care, she recently went to the emergency room have noticed some bright red blood per rectum, she also reported to have some abdominal pain has lost weight and complaining of lot of bloating and dyspepsia, she's concerned has strong family history of colon cancer, patient also reported to have chronic constipation, she denies any nausea vomiting denies any urinary symptoms.  Past Medical History  Diagnosis Date  . Depression   . Attention deficit disorder   . Anxiety     Past Surgical History  Procedure Laterality Date  . Tubal ligation        Medication List       This list is accurate as of: 12/29/12  6:15 PM.  Always use your most recent med list.               magnesium hydroxide 400 MG/5ML suspension  Commonly known as:  MILK OF MAGNESIA  Take 5 mLs by mouth daily as needed for mild constipation.     omeprazole 40 MG capsule  Commonly known as:  PRILOSEC  Take 1 capsule (40 mg total) by mouth daily.        Meds ordered this encounter  Medications  . magnesium hydroxide (MILK OF MAGNESIA) 400 MG/5ML suspension    Sig: Take 5 mLs by mouth daily as needed for mild constipation.    Dispense:  360 mL    Refill:  0  . omeprazole (PRILOSEC) 40 MG capsule    Sig: Take 1 capsule (40 mg total) by mouth daily.    Dispense:  30 capsule    Refill:  3     There is no immunization history on file for this patient.  History  Substance Use Topics  . Smoking status: Current Every Day Smoker -- 1.00 packs/day for 4 years    Types: Cigarettes  . Smokeless tobacco: Not on file  . Alcohol Use: No    Review of Systems  As noted in HPI  Filed Vitals:   12/29/12 1655  BP:  136/88  Pulse: 68  Temp: 98.7 F (37.1 C)  Resp: 17    Physical Exam  Physical Exam  Constitutional: No distress.  Eyes: EOM are normal. Pupils are equal, round, and reactive to light.  Abdominal:  Minimal epigastric tenderness no rebound or guarding, no CVA tenderness  Musculoskeletal: She exhibits no edema.    CBC    Component Value Date/Time   WBC 4.7 12/26/2012 1042   RBC 3.94 12/26/2012 1042   HGB 12.5 12/26/2012 1042   HCT 36.2 12/26/2012 1042   PLT 270 12/26/2012 1042   MCV 91.9 12/26/2012 1042   LYMPHSABS 2.3 12/26/2012 1042   MONOABS 0.3 12/26/2012 1042   EOSABS 0.4 12/26/2012 1042   BASOSABS 0.0 12/26/2012 1042    CMP     Component Value Date/Time   NA 135 12/26/2012 1042   K 3.3* 12/26/2012 1042   CL 101 12/26/2012 1042   CO2 26 12/26/2012 1042   GLUCOSE 100* 12/26/2012 1042   BUN 11 12/26/2012 1042   CREATININE 0.90 12/26/2012 1042   CALCIUM 9.3 12/26/2012 1042   PROT 7.2 12/26/2012 1042   ALBUMIN 4.1 12/26/2012 1042  AST 12 12/26/2012 1042   ALT 6 12/26/2012 1042   ALKPHOS 45 12/26/2012 1042   BILITOT 0.6 12/26/2012 1042   GFRNONAA 84* 12/26/2012 1042   GFRAA >90 12/26/2012 1042    No results found for this basename: chol,  tri,  ldl    No components found with this basename: hga1c    Lab Results  Component Value Date/Time   AST 12 12/26/2012 10:42 AM    Assessment and Plan  Loss of weight - Plan: Ambulatory referral to Gastroenterology  Menorrhagia - Plan: Ambulatory referral to Gynecology  Dyspepsia - Plan: omeprazole (PRILOSEC) 40 MG capsule  Chronic constipation - Plan: Increase fiber diet, we'll try magnesium hydroxide (MILK OF MAGNESIA) 400 MG/5ML suspension, Ambulatory referral to Gastroenterology  Abdominal  pain, other specified site - Plan: Ambulatory referral to Gastroenterology  Screening - Plan: She had borderline low potassium will repeat COMPLETE METABOLIC PANEL WITH GFR, TSH, Lipid panel, Vit D  25 hydroxy (rtn osteoporosis monitoring),  CANCELED: CBC with Differential    Return in about 4 weeks (around 01/26/2013).  Doris Cheadle, MD

## 2012-12-29 NOTE — Progress Notes (Signed)
Patient here to establish care Complains of weight lost in the past couple of months abd pain 2 menstrual cycles in one month Has a bowel movement once a week  Rectal bleeding

## 2013-01-02 ENCOUNTER — Telehealth: Payer: Self-pay | Admitting: Internal Medicine

## 2013-01-02 ENCOUNTER — Telehealth: Payer: Self-pay | Admitting: Emergency Medicine

## 2013-01-02 MED ORDER — VITAMIN D (ERGOCALCIFEROL) 1.25 MG (50000 UNIT) PO CAPS: 50000.0000 [IU] | ORAL_CAPSULE | ORAL | Status: DC

## 2013-01-02 NOTE — Telephone Encounter (Signed)
Message copied by Darlis Loan on Tue Jan 02, 2013  4:18 PM ------      Message from: Doris Cheadle      Created: Tue Jan 02, 2013 10:23 AM       Blood work reviewed, noticed low vitamin D, call patient advise to start ergocalciferol 50,000 units once a week for the duration of  12 weeks.      As well as cholesterol is elevated advise patient for low fat diet. ------

## 2013-01-02 NOTE — Telephone Encounter (Signed)
Pt called in today to see if she can get a patch to stop smoking as well as a script for minor pain; please contact pt

## 2013-01-02 NOTE — Telephone Encounter (Signed)
Left message for pt to call back for results.

## 2013-01-02 NOTE — Addendum Note (Signed)
Addended by: Nonnie Done D on: 01/02/2013 04:20 PM   Modules accepted: Orders

## 2013-01-03 ENCOUNTER — Telehealth: Payer: Self-pay | Admitting: Internal Medicine

## 2013-01-03 NOTE — Telephone Encounter (Signed)
Pt is calling to get results of lab work;

## 2013-01-04 ENCOUNTER — Telehealth: Payer: Self-pay | Admitting: Emergency Medicine

## 2013-01-04 NOTE — Telephone Encounter (Signed)
Pt given lab results and medication Vitamin D replacement. Informed we will recheck in 3 mnths. Scripts e-scribed to [facility]

## 2013-01-30 ENCOUNTER — Ambulatory Visit: Payer: Self-pay | Admitting: Internal Medicine

## 2013-01-30 ENCOUNTER — Telehealth: Payer: Self-pay

## 2013-02-06 ENCOUNTER — Encounter: Payer: Self-pay | Admitting: Obstetrics & Gynecology

## 2013-03-02 ENCOUNTER — Encounter: Payer: Self-pay | Admitting: Obstetrics & Gynecology

## 2013-11-16 ENCOUNTER — Encounter (HOSPITAL_COMMUNITY): Payer: Self-pay | Admitting: Emergency Medicine

## 2013-11-16 ENCOUNTER — Emergency Department (HOSPITAL_COMMUNITY)
Admission: EM | Admit: 2013-11-16 | Discharge: 2013-11-16 | Disposition: A | Discharge status: Home / Self Care | Payer: Self-pay | Attending: Emergency Medicine | Admitting: Emergency Medicine

## 2013-11-16 ENCOUNTER — Emergency Department (HOSPITAL_COMMUNITY): Payer: Self-pay

## 2013-11-16 DIAGNOSIS — R1012 Left upper quadrant pain: Secondary | ICD-10-CM | POA: Insufficient documentation

## 2013-11-16 DIAGNOSIS — Z9851 Tubal ligation status: Secondary | ICD-10-CM | POA: Insufficient documentation

## 2013-11-16 DIAGNOSIS — Z8659 Personal history of other mental and behavioral disorders: Secondary | ICD-10-CM | POA: Insufficient documentation

## 2013-11-16 DIAGNOSIS — Z720 Tobacco use: Secondary | ICD-10-CM | POA: Insufficient documentation

## 2013-11-16 DIAGNOSIS — R1032 Left lower quadrant pain: Secondary | ICD-10-CM | POA: Insufficient documentation

## 2013-11-16 DIAGNOSIS — Z3202 Encounter for pregnancy test, result negative: Secondary | ICD-10-CM | POA: Insufficient documentation

## 2013-11-16 DIAGNOSIS — R109 Unspecified abdominal pain: Secondary | ICD-10-CM

## 2013-11-16 LAB — COMPREHENSIVE METABOLIC PANEL
ALK PHOS: 49 U/L (ref 39–117)
ALT: 12 U/L (ref 0–35)
ANION GAP: 12 (ref 5–15)
AST: 18 U/L (ref 0–37)
Albumin: 4 g/dL (ref 3.5–5.2)
BILIRUBIN TOTAL: 0.6 mg/dL (ref 0.3–1.2)
BUN: 12 mg/dL (ref 6–23)
CHLORIDE: 101 meq/L (ref 96–112)
CO2: 24 meq/L (ref 19–32)
Calcium: 9.2 mg/dL (ref 8.4–10.5)
Creatinine, Ser: 0.92 mg/dL (ref 0.50–1.10)
GFR calc Af Amer: >90 mL/min (ref >90)
GFR, EST NON AFRICAN AMERICAN: 82 mL/min — AB (ref >90)
GLUCOSE: 97 mg/dL (ref 70–99)
Potassium: 3.9 mEq/L (ref 3.7–5.3)
Sodium: 137 mEq/L (ref 137–147)
Total Protein: 7.2 g/dL (ref 6.0–8.3)

## 2013-11-16 LAB — LIPASE, BLOOD: Lipase: 28 U/L (ref 11–59)

## 2013-11-16 LAB — URINALYSIS, ROUTINE W REFLEX MICROSCOPIC
BILIRUBIN URINE: NEGATIVE
GLUCOSE, UA: NEGATIVE mg/dL
HGB URINE DIPSTICK: NEGATIVE
Ketones, ur: NEGATIVE mg/dL
Leukocytes, UA: NEGATIVE
Nitrite: NEGATIVE
PROTEIN: NEGATIVE mg/dL
Specific Gravity, Urine: 1.007 (ref 1.005–1.030)
Urobilinogen, UA: 0.2 mg/dL (ref 0.0–1.0)
pH: 8 (ref 5.0–8.0)

## 2013-11-16 LAB — CBC WITH DIFFERENTIAL/PLATELET
BASOS ABS: 0 10*3/uL (ref 0.0–0.1)
Basophils Relative: 0 % (ref 0–1)
EOS PCT: 3 % (ref 0–5)
Eosinophils Absolute: 0.2 10*3/uL (ref 0.0–0.7)
HCT: 40.8 % (ref 36.0–46.0)
Hemoglobin: 13.9 g/dL (ref 12.0–15.0)
Lymphocytes Relative: 26 % (ref 12–46)
Lymphs Abs: 1.6 10*3/uL (ref 0.7–4.0)
MCH: 31.7 pg (ref 26.0–34.0)
MCHC: 34.1 g/dL (ref 30.0–36.0)
MCV: 92.9 fL (ref 78.0–100.0)
Monocytes Absolute: 0.4 10*3/uL (ref 0.1–1.0)
Monocytes Relative: 6 % (ref 3–12)
NEUTROS ABS: 3.9 10*3/uL (ref 1.7–7.7)
Neutrophils Relative %: 65 % (ref 43–77)
Platelets: 257 10*3/uL (ref 150–400)
RBC: 4.39 MIL/uL (ref 3.87–5.11)
RDW: 13.3 % (ref 11.5–15.5)
WBC: 6.1 10*3/uL (ref 4.0–10.5)

## 2013-11-16 LAB — POC URINE PREG, ED: Preg Test, Ur: NEGATIVE

## 2013-11-16 LAB — I-STAT TROPONIN, ED: Troponin i, poc: 0 ng/mL (ref 0.00–0.08)

## 2013-11-16 IMAGING — CT CT ABD-PELV W/ CM
1 of 2 series · 15 of 32 positions shown, 19 images · IV contrast (OMNIPAQUE 300)
Comparison: None.

CLINICAL DATA: Left-sided abdominal pain.

EXAM:
CT ABDOMEN AND PELVIS WITH CONTRAST
TECHNIQUE: Multidetector CT imaging of the abdomen and pelvis was performed
using the standard protocol following bolus administration of
intravenous contrast.
CONTRAST:  50mL OMNIPAQUE IOHEXOL 300 MG/ML SOLN, 80mL OMNIPAQUE
IOHEXOL 300 MG/ML SOLN

[Series 2: abd/pel with · axial · 0.59mm/px · z∈[+1190,+1580]mm · 15 of 86 slices shown, 19 images]
[im 4/86  soft-tissue]
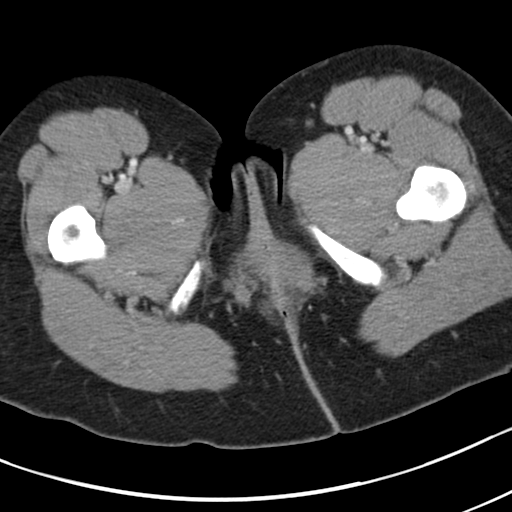
[im 4/86  bone]
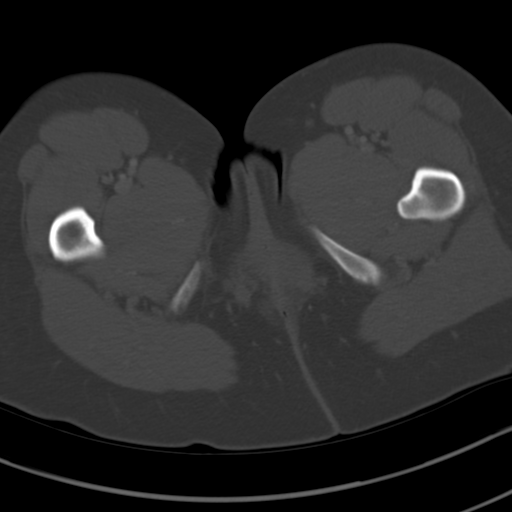
[im 11/86  soft-tissue]
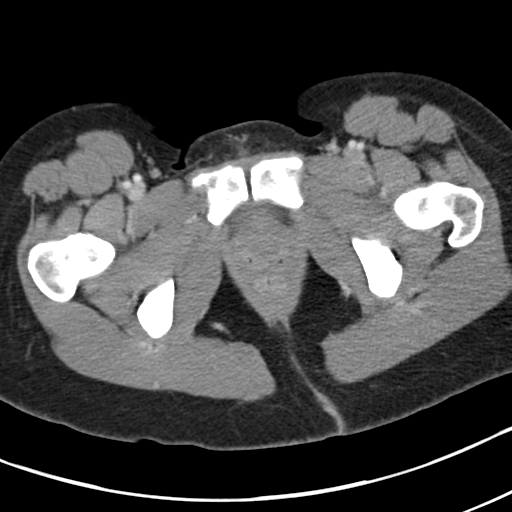
[im 18/86  soft-tissue]
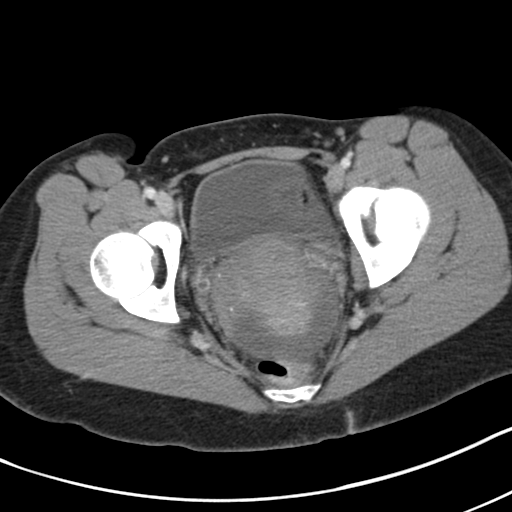
[im 24/86  soft-tissue]
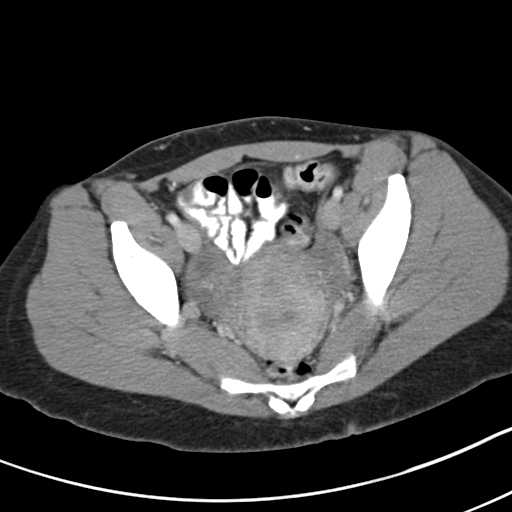
[im 31/86  soft-tissue]
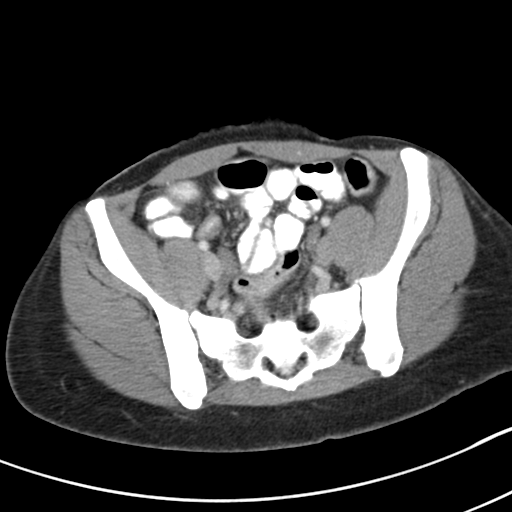
[im 38/86  soft-tissue]
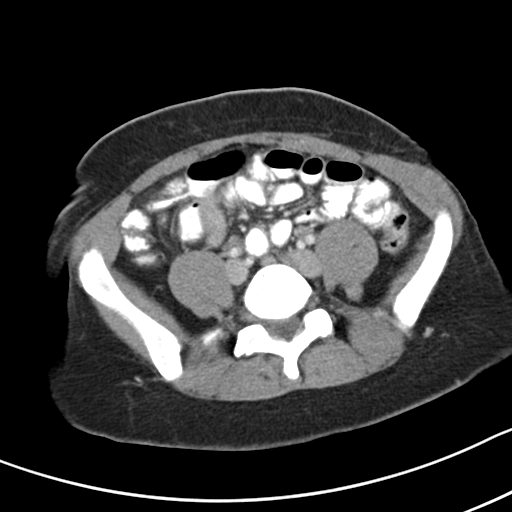
[im 45/86  soft-tissue]
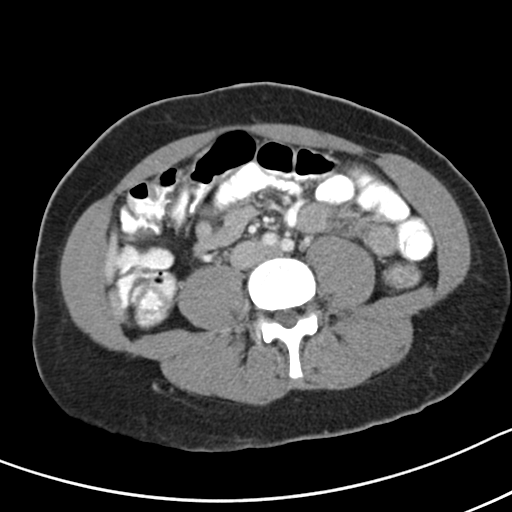
[im 48/86  soft-tissue]
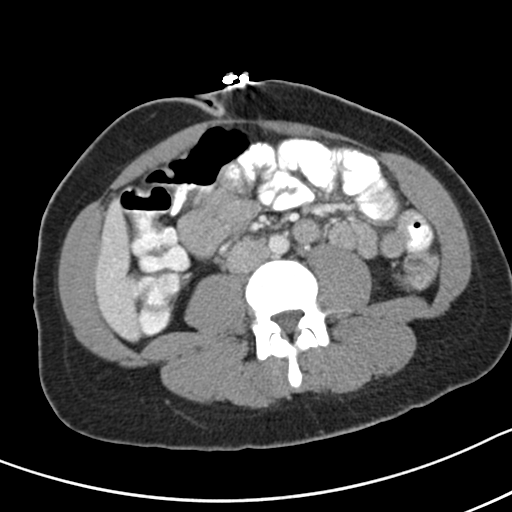
[im 55/86  soft-tissue]
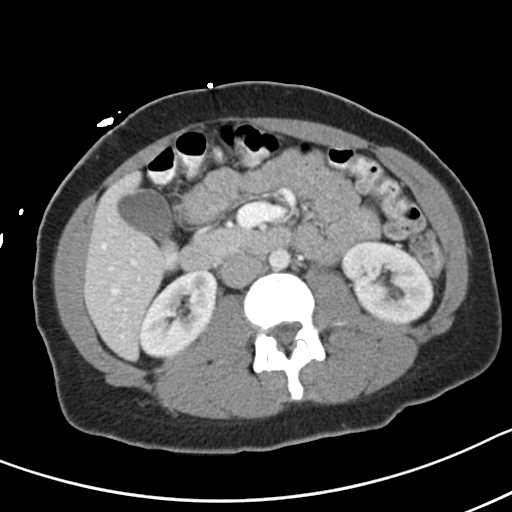
[im 55/86  bone]
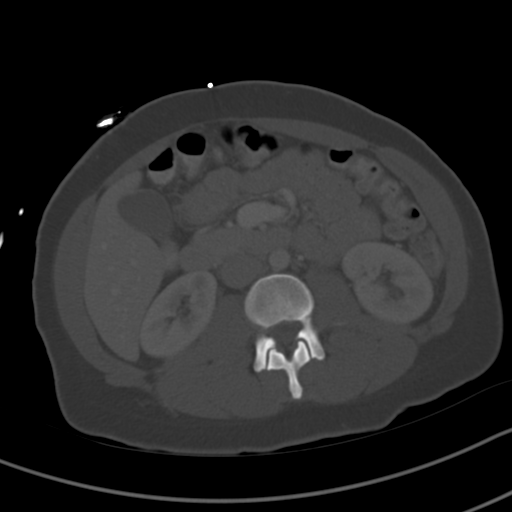
[im 62/86  soft-tissue]
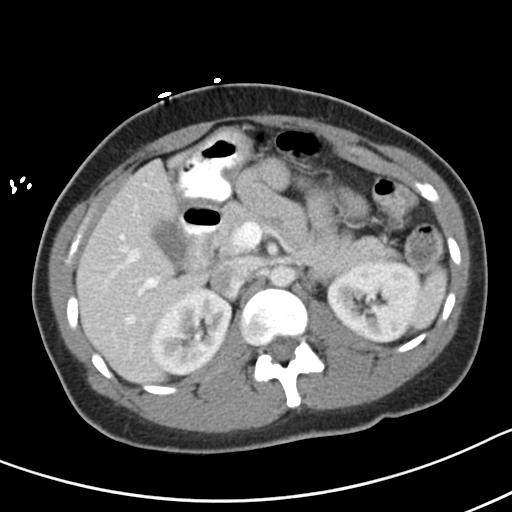
[im 69/86  soft-tissue]
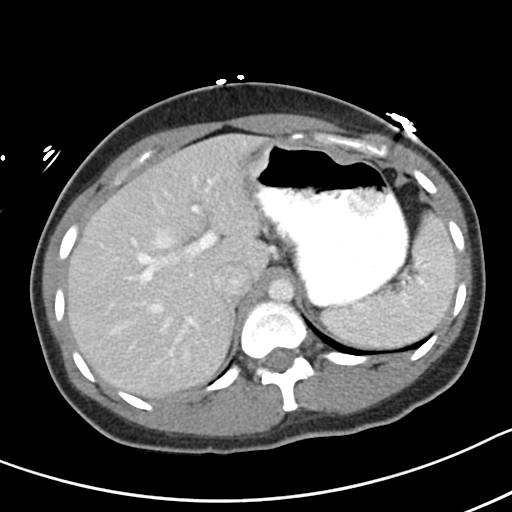
[im 72/86  lung]
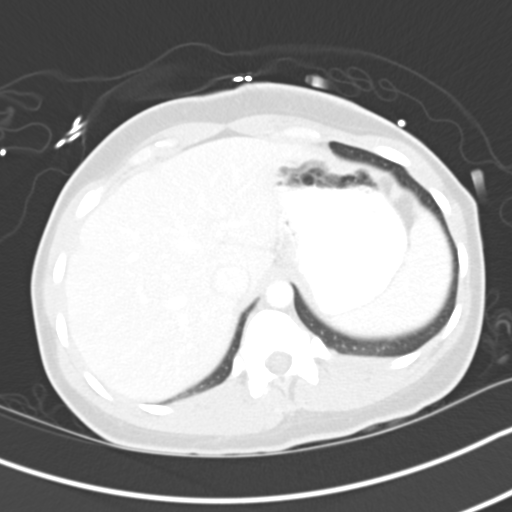
[im 75/86  soft-tissue]
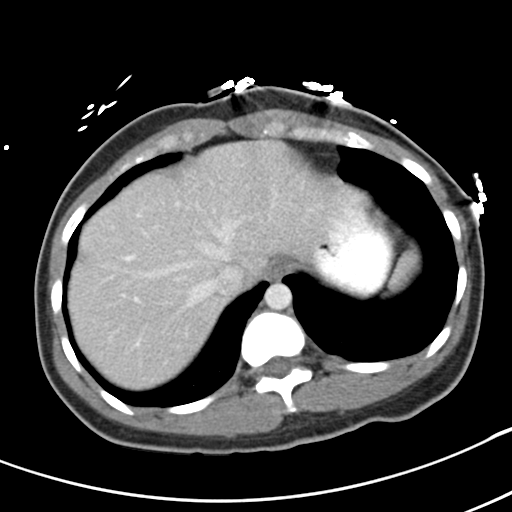
[im 75/86  lung]
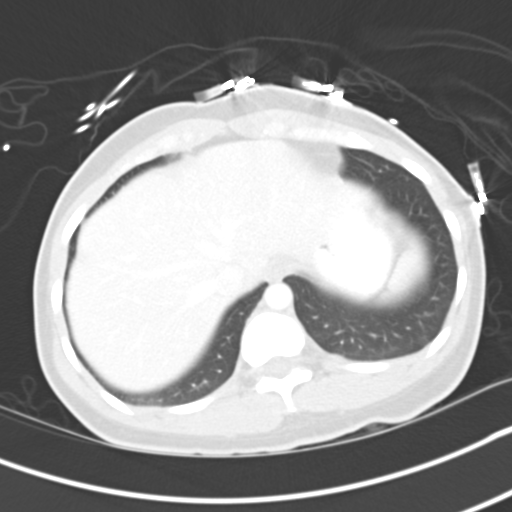
[im 79/86  lung]
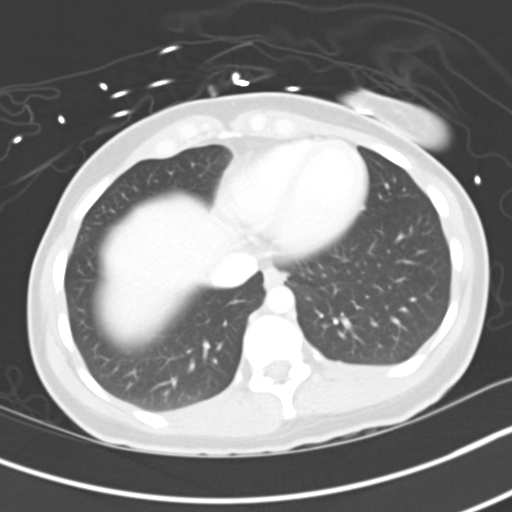
[im 82/86  soft-tissue]
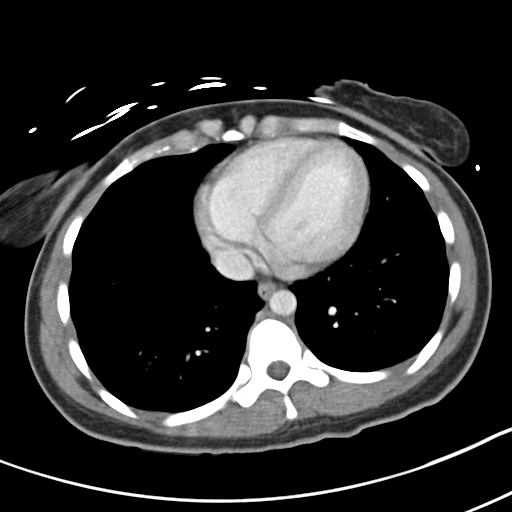
[im 82/86  lung]
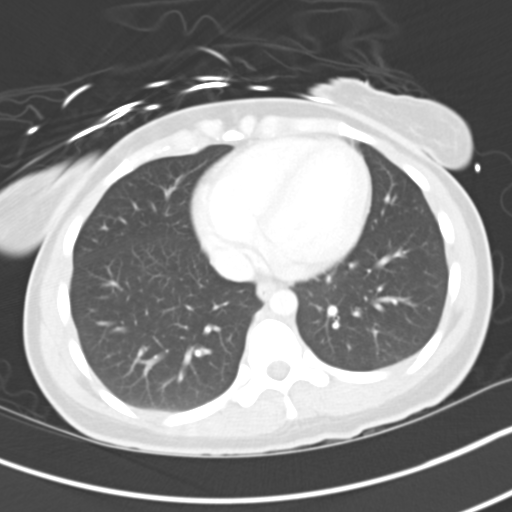

[15 of 32 positions shown; findings below may reference images not displayed]

FINDINGS: Visualized lung bases appear normal. No significant osseous
abnormality is noted.

No gallstones are noted. The liver, spleen and pancreas appear
normal. Adrenal glands and kidneys appear normal. No hydronephrosis
or renal obstruction is noted. The appendix appears normal. There is
no evidence of bowel obstruction. No abnormal fluid collection is
noted. Urinary bladder and uterus appear normal. Left ovary appears
normal. 2.9 cm right renal cyst is noted. Small amount of free fluid
is noted in the dependent portion of the pelvis which may be
physiologic. No significant adenopathy is noted.
IMPRESSION: 2.9 cm right ovarian cyst is noted. Small amount of free fluid is
noted in posterior cul-de-sac which may be physiologic. No other
abnormality seen in the abdomen or pelvis.

## 2013-11-16 MED ORDER — IOHEXOL 300 MG/ML  SOLN
50.0000 mL | Freq: Once | INTRAMUSCULAR | Status: AC | PRN
Start: 2013-11-16 — End: 2013-11-16
  Administered 2013-11-16: 50 mL via ORAL

## 2013-11-16 MED ORDER — IOHEXOL 300 MG/ML  SOLN
80.0000 mL | Freq: Once | INTRAMUSCULAR | Status: AC | PRN
Start: 2013-11-16 — End: 2013-11-16
  Administered 2013-11-16: 80 mL via INTRAVENOUS

## 2013-11-16 MED ORDER — DICYCLOMINE HCL 20 MG PO TABS: 20.0000 mg | ORAL_TABLET | Freq: Two times a day (BID) | ORAL | Status: AC

## 2013-11-16 MED ORDER — MORPHINE SULFATE 4 MG/ML IJ SOLN
4.0000 mg | Freq: Once | INTRAMUSCULAR | Status: AC
  Administered 2013-11-16: 4 mg via INTRAVENOUS
  Filled 2013-11-16: qty 1

## 2013-11-16 MED ORDER — ONDANSETRON HCL 4 MG/2ML IJ SOLN
4.0000 mg | Freq: Once | INTRAMUSCULAR | Status: AC
  Administered 2013-11-16: 4 mg via INTRAVENOUS
  Filled 2013-11-16: qty 2

## 2013-11-16 NOTE — ED Provider Notes (Signed)
CSN: [id number]     Arrival date & time 11/16/13  1117 History   First MD Initiated Contact with Patient 11/16/13 1226     Chief Complaint  Patient presents with  . Abdominal Pain     (Consider location/radiation/quality/duration/timing/severity/associated sxs/prior Treatment) Patient is a 33 y.o. female presenting with abdominal pain. The history is provided by the patient and a relative. No language interpreter was used.  Abdominal Pain Pain location:  LUQ and LLQ Pain quality: aching and sharp   Pain radiates to:  Does not radiate Pain severity:  Moderate Duration: 10 months. Timing:  Constant Progression:  Waxing and waning Chronicity:  Chronic Relieved by:  Nothing Worsened by:  Nothing tried Ineffective treatments:  None tried Associated symptoms: no anorexia, no belching, no chest pain, no chills, no cough, no diarrhea, no dysuria, no fatigue, no fever, no nausea, no shortness of breath, no sore throat and no vomiting   Associated symptoms comment:  Approximately 20 pound weight loss Risk factors: not elderly, has not had multiple surgeries, no NSAID use, not obese, not pregnant and no recent hospitalization   Risk factors comment:  Strong family history of colon cancer   Past Medical History  Diagnosis Date  . Depression   . Attention deficit disorder   . Anxiety    Past Surgical History  Procedure Laterality Date  . Tubal ligation     Family History  Problem Relation Age of Onset  . Cancer Father   . Colon polyps Sister   . Cancer Maternal Aunt   . Cancer Maternal Uncle    History  Substance Use Topics  . Smoking status: Current Every Day Smoker -- 1.00 packs/day for 4 years    Types: Cigarettes  . Smokeless tobacco: Not on file  . Alcohol Use: No   OB History   Grav Para Term Preterm Abortions TAB SAB Ect Mult Living                 Review of Systems  Constitutional: Negative for fever, chills, diaphoresis, activity change, appetite change and  fatigue.  HENT: Negative for congestion, facial swelling, rhinorrhea and sore throat.   Eyes: Negative for photophobia and discharge.  Respiratory: Negative for cough, chest tightness and shortness of breath.   Cardiovascular: Negative for chest pain, palpitations and leg swelling.  Gastrointestinal: Positive for abdominal pain. Negative for nausea, vomiting, diarrhea and anorexia.  Endocrine: Negative for polydipsia and polyuria.  Genitourinary: Negative for dysuria, frequency, difficulty urinating and pelvic pain.  Musculoskeletal: Negative for arthralgias, back pain, neck pain and neck stiffness.  Skin: Negative for color change and wound.  Allergic/Immunologic: Negative for immunocompromised state.  Neurological: Negative for facial asymmetry, weakness, numbness and headaches.  Hematological: Does not bruise/bleed easily.  Psychiatric/Behavioral: Negative for confusion and agitation.      Allergies  Review of patient's allergies indicates no known allergies.  Home Medications   Prior to Admission medications   Not on File   BP 110/64  Pulse 79  Temp(Src) 98.2 F (36.8 C) (Oral)  Resp 19  SpO2 100% Physical Exam  Constitutional: She is oriented to person, place, and time. She appears well-developed and well-nourished. No distress.  HENT:  Head: Normocephalic and atraumatic.  Mouth/Throat: No oropharyngeal exudate.  Eyes: Pupils are equal, round, and reactive to light.  Neck: Normal range of motion. Neck supple.  Cardiovascular: Normal rate, regular rhythm and normal heart sounds.  Exam reveals no gallop and no friction rub.   No  murmur heard. Pulmonary/Chest: Effort normal and breath sounds normal. No respiratory distress. She has no wheezes. She has no rales.  Abdominal: Soft. Bowel sounds are normal. She exhibits no distension and no mass. There is tenderness in the left upper quadrant and left lower quadrant. There is no rigidity, no rebound, no guarding and no CVA  tenderness.  Musculoskeletal: Normal range of motion. She exhibits no edema and no tenderness.  Neurological: She is alert and oriented to person, place, and time.  Skin: Skin is warm and dry.  Psychiatric: She has a normal mood and affect.    ED Course  Procedures (including critical care time) Labs Review Labs Reviewed  COMPREHENSIVE METABOLIC PANEL - Abnormal; Notable for the following:    GFR calc non Af Amer 82 (*)    All other components within normal limits  CBC WITH DIFFERENTIAL  URINALYSIS, ROUTINE W REFLEX MICROSCOPIC  LIPASE, BLOOD  I-STAT TROPOININ, ED  POC URINE PREG, ED    Imaging Review Ct Abdomen Pelvis W Contrast  11/16/2013   CLINICAL DATA:  Left-sided abdominal pain.  EXAM: CT ABDOMEN AND PELVIS WITH CONTRAST  TECHNIQUE: Multidetector CT imaging of the abdomen and pelvis was performed using the standard protocol following bolus administration of intravenous contrast.  CONTRAST:  50mL OMNIPAQUE IOHEXOL 300 MG/ML SOLN, 80mL OMNIPAQUE IOHEXOL 300 MG/ML SOLN  COMPARISON:  None.  FINDINGS: Visualized lung bases appear normal. No significant osseous abnormality is noted.  No gallstones are noted. The liver, spleen and pancreas appear normal. Adrenal glands and kidneys appear normal. No hydronephrosis or renal obstruction is noted. The appendix appears normal. There is no evidence of bowel obstruction. No abnormal fluid collection is noted. Urinary bladder and uterus appear normal. Left ovary appears normal. 2.9 cm right renal cyst is noted. Small amount of free fluid is noted in the dependent portion of the pelvis which may be physiologic. No significant adenopathy is noted.  IMPRESSION: 2.9 cm right ovarian cyst is noted. Small amount of free fluid is noted in posterior cul-de-sac which may be physiologic. No other abnormality seen in the abdomen or pelvis.   Electronically Signed   By: Roque LiasJames  Green M.D.   On: 11/16/2013 16:32     EKG Interpretation None      MDM    Final diagnoses:  Left sided abdominal pain    Pt is a 33 y.o. female with Pmhx as above who presents with left upper quadrant abdominal pain December 2014. No known aggravating or alleviating symptoms. She has had about a 20 pound weight loss since that time. She denies fever, chills, nausea, vomiting. She has intermittent constipation and diarrhea at baseline. She has a very strong family history of colon cancer. She had a normal colonoscopy in March of 2015. She's no acute findings of her CBC CMP UA. Given her concerning family history with weight loss CT abdomen pelvis ordered. It was positive for a 2.9 cm right ovarian cyst. I do not think that this is the cause of her pain. CT was otherwise normal. I will have her followup as outpatient with gastroenterology. Return precautions given for new worsening symptoms including pain, fever, inability to tolerate.        Toy CookeyMegan Docherty, MD 11/16/13 [phone]

## 2013-11-16 NOTE — Discharge Instructions (Signed)
Abdominal Pain, Women °Abdominal (stomach, pelvic, or belly) pain can be caused by many things. It is important to tell your doctor: °· The location of the pain. °· Does it come and go or is it present all the time? °· Are there things that start the pain (eating certain foods, exercise)? °· Are there other symptoms associated with the pain (fever, nausea, vomiting, diarrhea)? °All of this is helpful to know when trying to find the cause of the pain. °CAUSES  °· Stomach: virus or bacteria infection, or ulcer. °· Intestine: appendicitis (inflamed appendix), regional ileitis (Crohn's disease), ulcerative colitis (inflamed colon), irritable bowel syndrome, diverticulitis (inflamed diverticulum of the colon), or cancer of the stomach or intestine. °· Gallbladder disease or stones in the gallbladder. °· Kidney disease, kidney stones, or infection. °· Pancreas infection or cancer. °· Fibromyalgia (pain disorder). °· Diseases of the female organs: °¨ Uterus: fibroid (non-cancerous) tumors or infection. °¨ Fallopian tubes: infection or tubal pregnancy. °¨ Ovary: cysts or tumors. °¨ Pelvic adhesions (scar tissue). °¨ Endometriosis (uterus lining tissue growing in the pelvis and on the pelvic organs). °¨ Pelvic congestion syndrome (female organs filling up with blood just before the menstrual period). °¨ Pain with the menstrual period. °¨ Pain with ovulation (producing an egg). °¨ Pain with an IUD (intrauterine device, birth control) in the uterus. °¨ Cancer of the female organs. °· Functional pain (pain not caused by a disease, may improve without treatment). °· Psychological pain. °· Depression. °DIAGNOSIS  °Your doctor will decide the seriousness of your pain by doing an examination. °· Blood tests. °· X-rays. °· Ultrasound. °· CT scan (computed tomography, special type of X-ray). °· MRI (magnetic resonance imaging). °· Cultures, for infection. °· Barium enema (dye inserted in the large intestine, to better view it with  X-rays). °· Colonoscopy (looking in intestine with a lighted tube). °· Laparoscopy (minor surgery, looking in abdomen with a lighted tube). °· Major abdominal exploratory surgery (looking in abdomen with a large incision). °TREATMENT  °The treatment will depend on the cause of the pain.  °· Many cases can be observed and treated at home. °· Over-the-counter medicines recommended by your caregiver. °· Prescription medicine. °· Antibiotics, for infection. °· Birth control pills, for painful periods or for ovulation pain. °· Hormone treatment, for endometriosis. °· Nerve blocking injections. °· Physical therapy. °· Antidepressants. °· Counseling with a psychologist or psychiatrist. °· Minor or major surgery. °HOME CARE INSTRUCTIONS  °· Do not take laxatives, unless directed by your caregiver. °· Take over-the-counter pain medicine only if ordered by your caregiver. Do not take aspirin because it can cause an upset stomach or bleeding. °· Try a clear liquid diet (broth or water) as ordered by your caregiver. Slowly move to a bland diet, as tolerated, if the pain is related to the stomach or intestine. °· Have a thermometer and take your temperature several times a day, and record it. °· Bed rest and sleep, if it helps the pain. °· Avoid sexual intercourse, if it causes pain. °· Avoid stressful situations. °· Keep your follow-up appointments and tests, as your caregiver orders. °· If the pain does not go away with medicine or surgery, you may try: °¨ Acupuncture. °¨ Relaxation exercises (yoga, meditation). °¨ Group therapy. °¨ Counseling. °SEEK MEDICAL CARE IF:  °· You notice certain foods cause stomach pain. °· Your home care treatment is not helping your pain. °· You need stronger pain medicine. °· You want your IUD removed. °· You feel faint or   lightheaded. °· You develop nausea and vomiting. °· You develop a rash. °· You are having side effects or an allergy to your medicine. °SEEK IMMEDIATE MEDICAL CARE IF:  °· Your  pain does not go away or gets worse. °· You have a fever. °· Your pain is felt only in portions of the abdomen. The right side could possibly be appendicitis. The left lower portion of the abdomen could be colitis or diverticulitis. °· You are passing blood in your stools (bright red or black tarry stools, with or without vomiting). °· You have blood in your urine. °· You develop chills, with or without a fever. °· You pass out. °MAKE SURE YOU:  °· Understand these instructions. °· Will watch your condition. °· Will get help right away if you are not doing well or get worse. °Document Released: 11/08/2006 Document Revised: 05/28/2013 Document Reviewed: 11/28/2008 °[employer]® Patient Information ©2015 [employer]. This information is not intended to replace advice given to you by your health care provider. Make sure you discuss any questions you have with your health care provider. ° °

## 2013-11-16 NOTE — ED Notes (Signed)
Bed: WA08 Expected date:  Expected time:  Means of arrival:  Comments: EMS- LUQ abdominal pain

## 2013-11-16 NOTE — ED Notes (Signed)
Per EMS pt c/o LUQ abdominal pain x several months, was seen for this same pain last December, unable to see a PCP s/t lack of insurance. [facility], results were negative.

## 2014-07-14 ENCOUNTER — Emergency Department (HOSPITAL_BASED_OUTPATIENT_CLINIC_OR_DEPARTMENT_OTHER)
Admission: EM | Admit: 2014-07-14 | Discharge: 2014-07-14 | Disposition: A | Discharge status: Home / Self Care | Payer: Self-pay | Attending: Emergency Medicine | Admitting: Emergency Medicine

## 2014-07-14 ENCOUNTER — Encounter (HOSPITAL_BASED_OUTPATIENT_CLINIC_OR_DEPARTMENT_OTHER): Payer: Self-pay | Admitting: Emergency Medicine

## 2014-07-14 DIAGNOSIS — K088 Other specified disorders of teeth and supporting structures: Secondary | ICD-10-CM | POA: Insufficient documentation

## 2014-07-14 DIAGNOSIS — K0889 Other specified disorders of teeth and supporting structures: Secondary | ICD-10-CM

## 2014-07-14 DIAGNOSIS — K029 Dental caries, unspecified: Secondary | ICD-10-CM | POA: Insufficient documentation

## 2014-07-14 DIAGNOSIS — Z8659 Personal history of other mental and behavioral disorders: Secondary | ICD-10-CM | POA: Insufficient documentation

## 2014-07-14 DIAGNOSIS — Z720 Tobacco use: Secondary | ICD-10-CM | POA: Insufficient documentation

## 2014-07-14 DIAGNOSIS — Z79899 Other long term (current) drug therapy: Secondary | ICD-10-CM | POA: Insufficient documentation

## 2014-07-14 MED ORDER — PENICILLIN V POTASSIUM 250 MG PO TABS: 250.0000 mg | ORAL_TABLET | Freq: Four times a day (QID) | ORAL | Status: AC

## 2014-07-14 NOTE — ED Provider Notes (Signed)
CSN: [id number]     Arrival date & time 07/14/14  2054 History   First MD Initiated Contact with Patient 07/14/14 2106     Chief Complaint  Patient presents with  . Dental Pain     (Consider location/radiation/quality/duration/timing/severity/associated sxs/prior Treatment) HPI Comments: Pt comes in with right lower dental pain that started a long time ago but has worsened in the last 2 days. Denies fever, facial swelling or fever. No problems swallowing. States that the pain is radiating to her ear.  The history is provided by the patient. No language interpreter was used.    Past Medical History  Diagnosis Date  . Depression   . Attention deficit disorder   . Anxiety    Past Surgical History  Procedure Laterality Date  . Tubal ligation     Family History  Problem Relation Age of Onset  . Cancer Father   . Colon polyps Sister   . Cancer Maternal Aunt   . Cancer Maternal Uncle    History  Substance Use Topics  . Smoking status: Current Every Day Smoker -- 1.00 packs/day for 4 years    Types: Cigarettes  . Smokeless tobacco: Not on file  . Alcohol Use: No   OB History    No data available     Review of Systems  All other systems reviewed and are negative.     Allergies  Review of patient's allergies indicates no known allergies.  Home Medications   Prior to Admission medications   Medication Sig Start Date End Date Taking? Authorizing Provider  dicyclomine (BENTYL) 20 MG tablet Take 1 tablet (20 mg total) by mouth 2 (two) times daily. 11/16/13   Toy Cookey, MD   BP 119/73 mmHg  Pulse 80  Temp(Src) 99.4 F (37.4 C) (Oral)  Resp 20  Ht 5\' 1"  (1.549 m)  Wt 130 lb (58.968 kg)  BMI 24.58 kg/m2  SpO2 100%  LMP 07/13/2014 Physical Exam  Constitutional: She appears well-developed and well-nourished.  HENT:  Right Ear: External ear normal.  Left Ear: External ear normal.  Mouth/Throat: Oropharynx is clear and moist.  No obvious swelling or deformity  noted to right dentition. Teeth ttp. No facial swelling noted. No obvious decay noted  Cardiovascular: Normal rate and regular rhythm.   Pulmonary/Chest: Effort normal and breath sounds normal.  Musculoskeletal: Normal range of motion.  Nursing note and vitals reviewed.   ED Course  Procedures (including critical care time) Labs Review Labs Reviewed - No data to display  Imaging Review No results found.   EKG Interpretation None      MDM   Final diagnoses:  Toothache    No obvious swelling or deformity noted. Pt given pcn for pain    Teressa Lower, NP 07/14/14 4098  Pricilla Loveless, MD 07/15/14 1504

## 2014-07-14 NOTE — ED Notes (Signed)
Report lower jaw pain related to tooth " that needs to be pulled". Going on for years yet increase pain in last couple of days. No facial swelling noted.

## 2014-07-14 NOTE — ED Notes (Signed)
Patient has had pain to her mouth x years

## 2014-07-14 NOTE — Discharge Instructions (Signed)

## 2015-02-15 ENCOUNTER — Emergency Department (HOSPITAL_COMMUNITY)
Admission: EM | Admit: 2015-02-15 | Discharge: 2015-02-15 | Disposition: A | Discharge status: Home / Self Care | Payer: Self-pay | Attending: Emergency Medicine | Admitting: Emergency Medicine

## 2015-02-15 DIAGNOSIS — K029 Dental caries, unspecified: Secondary | ICD-10-CM | POA: Insufficient documentation

## 2015-02-15 DIAGNOSIS — K047 Periapical abscess without sinus: Secondary | ICD-10-CM | POA: Insufficient documentation

## 2015-02-15 DIAGNOSIS — Z8659 Personal history of other mental and behavioral disorders: Secondary | ICD-10-CM | POA: Insufficient documentation

## 2015-02-15 DIAGNOSIS — F17210 Nicotine dependence, cigarettes, uncomplicated: Secondary | ICD-10-CM | POA: Insufficient documentation

## 2015-02-15 DIAGNOSIS — Z720 Tobacco use: Secondary | ICD-10-CM

## 2015-02-15 DIAGNOSIS — Z79899 Other long term (current) drug therapy: Secondary | ICD-10-CM | POA: Insufficient documentation

## 2015-02-15 MED ORDER — NAPROXEN 500 MG PO TABS: 500.0000 mg | ORAL_TABLET | Freq: Two times a day (BID) | ORAL | Status: DC | PRN

## 2015-02-15 MED ORDER — HYDROCODONE-ACETAMINOPHEN 5-325 MG PO TABS: 1.0000 | ORAL_TABLET | Freq: Four times a day (QID) | ORAL | Status: AC | PRN

## 2015-02-15 MED ORDER — PENICILLIN V POTASSIUM 500 MG PO TABS: 1000.0000 mg | ORAL_TABLET | Freq: Two times a day (BID) | ORAL | Status: DC

## 2015-02-15 MED ORDER — HYDROCODONE-ACETAMINOPHEN 5-325 MG PO TABS
1.0000 | ORAL_TABLET | Freq: Once | ORAL | Status: AC
  Administered 2015-02-15: 1 via ORAL
  Filled 2015-02-15: qty 1

## 2015-02-15 NOTE — ED Notes (Signed)
Patient is having dental pain on the right side on the bottom in the far back. Patient has been having this pain for three days. Patient states she has a cavity that is getting larger. Patient says she can not eat on that side because it is so sore.

## 2015-02-15 NOTE — ED Notes (Signed)
Patient d/c'd self care.  Patients friend is picking her up in lobby.  F/U and medications discussed.  Patient verbalized understanding.

## 2015-02-15 NOTE — Discharge Instructions (Signed)
Apply warm compresses to jaw throughout the day. Take antibiotic until finished. Take naprosyn and norco as directed, as needed for pain but do not drive or operate machinery with pain medication use. Followup with a dentist is very important for ongoing evaluation and management of recurrent dental pain. Use the list below to find a dentist. STOP SMOKING! Return to emergency department for emergent changing or worsening symptoms.   Dental Caries Dental caries (also called tooth decay) is the most common oral disease. It can occur at any age but is more common in children and young adults.  HOW DENTAL CARIES DEVELOPS  The process of decay begins when bacteria and foods (particularly sugars and starches) combine in your mouth to produce plaque. Plaque is a substance that sticks to the hard, outer surface of a tooth (enamel). The bacteria in plaque produce acids that attack enamel. These acids may also attack the root surface of a tooth (cementum) if it is exposed. Repeated attacks dissolve these surfaces and create holes in the tooth (cavities). If left untreated, the acids destroy the other layers of the tooth.  RISK FACTORS  Frequent sipping of sugary beverages.   Frequent snacking on sugary and starchy foods, especially those that easily get stuck in the teeth.   Poor oral hygiene.   Dry mouth.   Substance abuse such as methamphetamine abuse.   Broken or poor-fitting dental restorations.   Eating disorders.   Gastroesophageal reflux disease (GERD).   Certain radiation treatments to the head and neck. SYMPTOMS In the early stages of dental caries, symptoms are seldom present. Sometimes white, chalky areas may be seen on the enamel or other tooth layers. In later stages, symptoms may include:  Pits and holes on the enamel.  Toothache after sweet, hot, or cold foods or drinks are consumed.  Pain around the tooth.  Swelling around the tooth. DIAGNOSIS  Most of the time,  dental caries is detected during a regular dental checkup. A diagnosis is made after a thorough medical and dental history is taken and the surfaces of your teeth are checked for signs of dental caries. Sometimes special instruments, such as lasers, are used to check for dental caries. Dental X-ray exams may be taken so that areas not visible to the eye (such as between the contact areas of the teeth) can be checked for cavities.  TREATMENT  If dental caries is in its early stages, it may be reversed with a fluoride treatment or an application of a remineralizing agent at the dental office. Thorough brushing and flossing at home is needed to aid these treatments. If it is in its later stages, treatment depends on the location and extent of tooth destruction:   If a small area of the tooth has been destroyed, the destroyed area will be removed and cavities will be filled with a material such as gold, silver amalgam, or composite resin.   If a large area of the tooth has been destroyed, the destroyed area will be removed and a cap (crown) will be fitted over the remaining tooth structure.   If the center part of the tooth (pulp) is affected, a procedure called a root canal will be needed before a filling or crown can be placed.   If most of the tooth has been destroyed, the tooth may need to be pulled (extracted). HOME CARE INSTRUCTIONS You can prevent, stop, or reverse dental caries at home by practicing good oral hygiene. Good oral hygiene includes:  Thoroughly cleaning  your teeth at least twice a day with a toothbrush and dental floss.   Using a fluoride toothpaste. A fluoride mouth rinse may also be used if recommended by your dentist or health care provider.   Restricting the amount of sugary and starchy foods and sugary liquids you consume.   Avoiding frequent snacking on these foods and sipping of these liquids.   Keeping regular visits with a dentist for checkups and  cleanings. PREVENTION   Practice good oral hygiene.  Consider a dental sealant. A dental sealant is a coating material that is applied by your dentist to the pits and grooves of teeth. The sealant prevents food from being trapped in them. It may protect the teeth for several years.  Ask about fluoride supplements if you live in a community without fluorinated water or with water that has a low fluoride content. Use fluoride supplements as directed by your dentist or health care provider.  Allow fluoride varnish applications to teeth if directed by your dentist or health care provider.   This information is not intended to replace advice given to you by your health care provider. Make sure you discuss any questions you have with your health care provider.   Document Released: 10/03/2001 Document Revised: 02/01/2014 Document Reviewed: 01/14/2012 Elsevier Interactive Patient Education 2016 Elsevier Inc.  Dental Pain Dental pain may be caused by many things, including:  Tooth decay (cavities or caries). Cavities expose the nerve of your tooth to air and hot or cold temperatures. This can cause pain or discomfort.  Abscess or infection. A dental abscess is a collection of infected pus from a bacterial infection in the inner part of the tooth (pulp). It usually occurs at the end of the tooth's root.  Injury.  An unknown reason (idiopathic). Your pain may be mild or severe. It may only occur when:  You are chewing.  You are exposed to hot or cold temperature.  You are eating or drinking sugary foods or beverages, such as soda or candy. Your pain may also be constant. HOME CARE INSTRUCTIONS Watch your dental pain for any changes. The following actions may help to lessen any discomfort that you are feeling:  Take medicines only as directed by your dentist.  If you were prescribed an antibiotic medicine, finish all of it even if you start to feel better.  Keep all follow-up visits as  directed by your dentist. This is important.  Do not apply heat to the outside of your face.  Rinse your mouth or gargle with salt water if directed by your dentist. This helps with pain and swelling.  You can make salt water by adding  tsp of salt to 1 cup of warm water.  Apply ice to the painful area of your face:  Put ice in a plastic bag.  Place a towel between your skin and the bag.  Leave the ice on for 20 minutes, 2-3 times per day.  Avoid foods or drinks that cause you pain, such as:  Very hot or very cold foods or drinks.  Sweet or sugary foods or drinks. SEEK MEDICAL CARE IF:  Your pain is not controlled with medicines.  Your symptoms are worse.  You have new symptoms. SEEK IMMEDIATE MEDICAL CARE IF:  You are unable to open your mouth.  You are having trouble breathing or swallowing.  You have a fever.  Your face, neck, or jaw is swollen.   This information is not intended to replace advice given to  you by your health care provider. Make sure you discuss any questions you have with your health care provider.   Document Released: 01/11/2005 Document Revised: 05/28/2014 Document Reviewed: 01/07/2014 Elsevier Interactive Patient Education 2016 [employer].  Smoking Cessation, Tips for Success If you are ready to quit smoking, congratulations! You have chosen to help yourself be healthier. Cigarettes bring nicotine, tar, carbon monoxide, and other irritants into your body. Your lungs, heart, and blood vessels will be able to work better without these poisons. There are many different ways to quit smoking. Nicotine gum, nicotine patches, a nicotine inhaler, or nicotine nasal spray can help with physical craving. Hypnosis, support groups, and medicines help break the habit of smoking. WHAT THINGS CAN I DO TO MAKE QUITTING EASIER?  Here are some tips to help you quit for good:  Pick a date when you will quit smoking completely. Tell all of your friends and family  about your plan to quit on that date.  Do not try to slowly cut down on the number of cigarettes you are smoking. Pick a quit date and quit smoking completely starting on that day.  Throw away all cigarettes.   Clean and remove all ashtrays from your home, work, and car.  On a card, write down your reasons for quitting. Carry the card with you and read it when you get the urge to smoke.  Cleanse your body of nicotine. Drink enough water and fluids to keep your urine clear or pale yellow. Do this after quitting to flush the nicotine from your body.  Learn to predict your moods. Do not let a bad situation be your excuse to have a cigarette. Some situations in your life might tempt you into wanting a cigarette.  Never have "just one" cigarette. It leads to wanting another and another. Remind yourself of your decision to quit.  Change habits associated with smoking. If you smoked while driving or when feeling stressed, try other activities to replace smoking. Stand up when drinking your coffee. Brush your teeth after eating. Sit in a different chair when you read the paper. Avoid alcohol while trying to quit, and try to drink fewer caffeinated beverages. Alcohol and caffeine may urge you to smoke.  Avoid foods and drinks that can trigger a desire to smoke, such as sugary or spicy foods and alcohol.  Ask people who smoke not to smoke around you.  Have something planned to do right after eating or having a cup of coffee. For example, plan to take a walk or exercise.  Try a relaxation exercise to calm you down and decrease your stress. Remember, you may be tense and nervous for the first 2 weeks after you quit, but this will pass.  Find new activities to keep your hands busy. Play with a pen, coin, or rubber band. Doodle or draw things on paper.  Brush your teeth right after eating. This will help cut down on the craving for the taste of tobacco after meals. You can also try mouthwash.   Use  oral substitutes in place of cigarettes. Try using lemon drops, carrots, cinnamon sticks, or chewing gum. Keep them handy so they are available when you have the urge to smoke.  When you have the urge to smoke, try deep breathing.  Designate your home as a nonsmoking area.  If you are a heavy smoker, ask your health care provider about a prescription for nicotine chewing gum. It can ease your withdrawal from nicotine.  Reward yourself.  Set aside the cigarette money you save and buy yourself something nice.  Look for support from others. Join a support group or smoking cessation program. Ask someone at home or at work to help you with your plan to quit smoking.  Always ask yourself, "Do I need this cigarette or is this just a reflex?" Tell yourself, "Today, I choose not to smoke," or "I do not want to smoke." You are reminding yourself of your decision to quit.  Do not replace cigarette smoking with electronic cigarettes (commonly called e-cigarettes). The safety of e-cigarettes is unknown, and some may contain harmful chemicals.  If you relapse, do not give up! Plan ahead and think about what you will do the next time you get the urge to smoke. HOW WILL I FEEL WHEN I QUIT SMOKING? You may have symptoms of withdrawal because your body is used to nicotine (the addictive substance in cigarettes). You may crave cigarettes, be irritable, feel very hungry, cough often, get headaches, or have difficulty concentrating. The withdrawal symptoms are only temporary. They are strongest when you first quit but will go away within 10-14 days. When withdrawal symptoms occur, stay in control. Think about your reasons for quitting. Remind yourself that these are signs that your body is healing and getting used to being without cigarettes. Remember that withdrawal symptoms are easier to treat than the major diseases that smoking can cause.  Even after the withdrawal is over, expect periodic urges to smoke. However,  these cravings are generally short lived and will go away whether you smoke or not. Do not smoke! WHAT RESOURCES ARE AVAILABLE TO HELP ME QUIT SMOKING? Your health care provider can direct you to community resources or hospitals for support, which may include:  Group support.  Education.  Hypnosis.  Therapy.   This information is not intended to replace advice given to you by your health care provider. Make sure you discuss any questions you have with your health care provider.   Document Released: 10/10/2003 Document Revised: 02/01/2014 Document Reviewed: 06/29/2012 Elsevier Interactive Patient Education 2016 [employer].   Emergency Department Resource Guide 1) Find a Doctor and Pay Out of Pocket Although you won't have to find out who is covered by your insurance plan, it is a good idea to ask around and get recommendations. You will then need to call the office and see if the doctor you have chosen will accept you as a new patient and what types of options they offer for patients who are self-pay. Some doctors offer discounts or will set up payment plans for their patients who do not have insurance, but you will need to ask so you aren't surprised when you get to your appointment.  2) Contact Your Local Health Department Not all health departments have doctors that can see patients for sick visits, but many do, so it is worth a call to see if yours does. If you don't know where your local health department is, you can check in your phone book. The CDC also has a tool to help you locate your state's health department, and many state websites also have listings of all of their local health departments.  3) Find a Walk-in Clinic If your illness is not likely to be very severe or complicated, you may want to try a walk in clinic. These are popping up all over the country in pharmacies, drugstores, and shopping centers. They're usually staffed by nurse practitioners or physician assistants  that have been trained  to treat common illnesses and complaints. They're usually fairly quick and inexpensive. However, if you have serious medical issues or chronic medical problems, these are probably not your best option.  No Primary Care Doctor: - Call [facility] at  [phone] - they can help you locate a primary care doctor that  accepts your insurance, provides certain services, etc. - Physician Referral Service- [phone]  Chronic Pain Problems: Organization         Address  Phone   Notes  [facility]  [phone] Patients need to be referred by their primary care doctor.   Medication Assistance: Organization         Address  Phone   Notes  [facility] [address] [place] [place], [place] [address] [phone] --Must be a resident of [facility] -- Must have NO insurance coverage whatsoever (no Medicaid/ Medicare, etc.) -- The pt. MUST have a primary care doctor that directs their care regularly and follows them in the community   [facility]  [phone]   [place]  [phone]     Dental Care: Organization         Address  Phone  Notes  [facility] Department of [facility] [address] [place], [place] [phone] Accepts children up to age 38 who are enrolled in [place] or [place] Health Choice; pregnant women with a Medicaid card; and children who have applied for Medicaid or [place] Health Choice, but were declined, whose parents can pay a reduced fee at time of service.  [facility] Department of [facility]  [address], [place] [phone] Accepts children up to age 77 who are enrolled in [place] or [place] Health Choice; pregnant women with a Medicaid card; and children who have applied for Medicaid or [place] Health Choice, but were declined, whose parents can pay a reduced fee at time of service.  [facility]  [address] [place], [place] [phone] Patients are seen by appointment only. Walk-ins are not accepted. [facility] will see patients 34 years of age and older. Monday - Tuesday (8am-5pm) Most Wednesdays (8:30-5pm) $30 per visit, cash only  [facility]  [address], [facility] [phone] Patients are seen by appointment only. Walk-ins are not accepted. [facility] will see patients 49 years of age and older. One Wednesday Evening (Monthly: Volunteer Based).  $30 per visit, cash only  [employer] [facility] [employer]  [phone] for adults; Children under age 88, call Graduate Pediatric Dentistry at [phone]. Children aged 78-14, please call [phone] to request a pediatric application.  Dental services are provided in all areas of dental care including fillings, crowns and bridges, complete and partial dentures, implants, gum treatment, root canals, and extractions. Preventive care is also provided. Treatment is provided to both adults and children. Patients are selected via a lottery and there is often a waiting list.   [facility] [address], [place]  [phone] [email or web address]   [facility] [address] [place], [place] [phone], Ext. [phone] Second and Fourth Thursday of each month, opens at 6:30 AM; Clinic ends at 9 AM.  Patients are seen on a first-come first-served basis, and a limited number are seen during each clinic.   [facility]  [address] [place], [place] [phone]  Eligibility Requirements You must have lived in [place], [place], or [place] counties for at least the last three months.   You cannot be eligible for state or federal sponsored [place], including [employer], [place], or [employer].   You generally cannot be eligible for healthcare insurance through your employer.    How to apply: Eligibility screenings are held  every Tuesday and Wednesday afternoon from 1:00 pm until 4:00 pm. You do not need an appointment for the interview!  [facility] [address] [place], [place] [phone]   [facility] Health Department  [phone]   [facility] Health Department  [phone]   [facility] Department  [phone]

## 2015-02-15 NOTE — ED Notes (Signed)
Patient states dental pain lower right.  Pain coming and going for last 2 weeks.  Patient could no longer stand pain this morning.  Patient denies nausea, vomiting, and fevers.

## 2015-02-15 NOTE — ED Provider Notes (Signed)
CSN: [id number]     Arrival date & time 02/15/15  0610 History   First MD Initiated Contact with Patient 02/15/15 0631     Chief Complaint  Patient presents with  . Dental Pain  . Dental Problem     (Consider location/radiation/quality/duration/timing/severity/associated sxs/prior Treatment) HPI Comments: [name] is a 35 y.o. female with a PMHx of ADHD, lynch syndrome, and depression, who presents to the ED with complaints of right lower dental pain 2 weeks worsening gradually over the last 3 days. Patient states she has a hole in the tooth and has not had money to afford going to the dentist since she has no insurance. Patient describes the pain is 7/10 constant throbbing pain radiating to the right ear, worse with chewing, and unrelieved with Motrin. She admits to being a smoker.  Denies fevers, chills, trismus, drooling, facial/neck swelling or pain, gum swelling/drainage, ear drainage, CP, SOB, abd pain, N/V/D/C, hematuria, dysuria, myalgias, arthralgias, numbness, tingling, weakness, or other symptoms. NKDA  Patient is a 35 y.o. female presenting with tooth pain. The history is provided by the patient. No language interpreter was used.  Dental Pain Location:  Lower Lower teeth location:  29/RL 2nd bicuspid Quality:  Throbbing Severity:  Severe Onset quality:  Gradual Duration:  2 weeks Timing:  Constant Progression:  Worsening Chronicity:  Recurrent Context: dental caries   Relieved by:  Nothing Worsened by:  Pressure Ineffective treatments:  NSAIDs Associated symptoms: no difficulty swallowing, no drooling, no facial swelling, no fever, no gum swelling, no neck pain, no neck swelling, no oral bleeding and no trismus   Risk factors: lack of dental care and smoking   Risk factors: no immunosuppression     Past Medical History  Diagnosis Date  . Depression   . Attention deficit disorder   . Anxiety   . Lynch syndrome    Past Surgical History  Procedure Laterality  Date  . Tubal ligation     Family History  Problem Relation Age of Onset  . Cancer Father   . Colon polyps Sister   . Cancer Maternal Aunt   . Cancer Maternal Uncle    Social History  Substance Use Topics  . Smoking status: Current Every Day Smoker -- 1.00 packs/day for 4 years    Types: Cigarettes  . Smokeless tobacco: Never Used  . Alcohol Use: No   OB History    No data available     Review of Systems  Constitutional: Negative for fever and chills.  HENT: Positive for dental problem. Negative for drooling, ear discharge, facial swelling and trouble swallowing.   Respiratory: Negative for shortness of breath.   Cardiovascular: Negative for chest pain.  Gastrointestinal: Negative for nausea, vomiting, abdominal pain, diarrhea and constipation.  Genitourinary: Negative for dysuria and hematuria.  Musculoskeletal: Negative for myalgias, arthralgias and neck pain.  Skin: Negative for color change.  Allergic/Immunologic: Negative for immunocompromised state.  Neurological: Negative for weakness and numbness.  Psychiatric/Behavioral: Negative for confusion.   10 Systems reviewed and are negative for acute change except as noted in the HPI.    Allergies  Review of patient's allergies indicates no known allergies.  Home Medications   Prior to Admission medications   Medication Sig Start Date End Date Taking? Authorizing Provider  dicyclomine (BENTYL) 20 MG tablet Take 1 tablet (20 mg total) by mouth 2 (two) times daily. 11/16/13   Toy Cookey, MD   BP 104/66 mmHg  Pulse 60  Temp(Src) 98.9 F (37.2  C) (Oral)  Resp 18  Ht  (1.549 m)  Wt 58.968 kg  BMI 24.58 kg/m2  SpO2 100% Physical Exam  Constitutional: She is oriented to person, place, and time. Vital signs are normal. She appears well-developed and well-nourished.  Non-toxic appearance. No distress.  Afebrile, nontoxic, NAD  HENT:  Head: Normocephalic and atraumatic.  Nose: Nose normal.  Mouth/Throat:  Uvula is midline, oropharynx is clear and moist and mucous membranes are normal. No trismus in the jaw. Dental caries present. No dental abscesses or uvula swelling.    R lower tooth #29 with small hole/decay in tooth, minimal surrounding erythema without gum swelling or drainage, no dental abscess, tooth mildly TTP, no evidence of ludwig's. Nose clear. Oropharynx clear and moist, without uvular swelling or deviation, no trismus or drooling, no tonsillar swelling or erythema, no exudates.    Eyes: Conjunctivae and EOM are normal. Right eye exhibits no discharge. Left eye exhibits no discharge.  Neck: Normal range of motion. Neck supple.  Cardiovascular: Normal rate.   Pulmonary/Chest: Effort normal. No respiratory distress.  Abdominal: Normal appearance. She exhibits no distension.  Musculoskeletal: Normal range of motion.  Neurological: She is alert and oriented to person, place, and time. She has normal strength. No sensory deficit.  Skin: Skin is warm, dry and intact. No rash noted.  Psychiatric: She has a normal mood and affect. Her behavior is normal.  Nursing note and vitals reviewed.   ED Course  Procedures (including critical care time) Labs Review Labs Reviewed - No data to display  Imaging Review No results found. I have personally reviewed and evaluated these images and lab results as part of my medical decision-making.   EKG Interpretation None      MDM   Final diagnoses:  Dental decay  Pain due to dental caries  Dental infection  Tobacco use    35 y.o. female here with Dental pain associated with dental decay and possible dental infection with patient afebrile, non toxic appearing and swallowing secretions well. I gave patient referral to dentist and stressed the importance of dental follow up for ultimate management of dental pain.  I have also discussed reasons to return immediately to the ER.  Patient expresses understanding and agrees with plan.  I will also  give PCN VK and pain control.  Smoking cessation discussed.  BP 104/66 mmHg  Pulse 60  Temp(Src) 98.9 F (37.2 C) (Oral)  Resp 18  Ht  (1.549 m)  Wt 58.968 kg  BMI 24.58 kg/m2  SpO2 100%  Meds ordered this encounter  Medications  . HYDROcodone-acetaminophen (NORCO/VICODIN) 5-325 MG per tablet 1 tablet    Sig:   . HYDROcodone-acetaminophen (NORCO) 5-325 MG tablet    Sig: Take 1 tablet by mouth every 6 (six) hours as needed for severe pain.    Dispense:  4 tablet    Refill:  0    Order Specific Question:  Supervising Provider    Answer:  MILLER, BRIAN [[id number]]  . naproxen (NAPROSYN) 500 MG tablet    Sig: Take 1 tablet (500 mg total) by mouth 2 (two) times daily as needed for mild pain, moderate pain or headache (TAKE WITH MEALS.).    Dispense:  20 tablet    Refill:  0    Order Specific Question:  Supervising Provider    Answer:  MILLER, BRIAN [[id number]]  . penicillin v potassium (VEETID) 500 MG tablet    Sig: Take 2 tablets (1,000 mg total) by mouth 2 (  two) times daily. X 7 days    Dispense:  28 tablet    Refill:  0    Order Specific Question:  Supervising Provider    Answer:  Eber Hong [[id number]]        Devin Foskey Camprubi-Soms, PA-C 02/15/15 1610  Dione Booze, MD 02/15/15 [phone]

## 2015-03-28 ENCOUNTER — Ambulatory Visit: Payer: Self-pay | Admitting: Gastroenterology

## 2017-01-23 ENCOUNTER — Emergency Department (HOSPITAL_BASED_OUTPATIENT_CLINIC_OR_DEPARTMENT_OTHER)
Admission: EM | Admit: 2017-01-23 | Discharge: 2017-01-23 | Disposition: A | Discharge status: Home / Self Care | Payer: Self-pay | Attending: Emergency Medicine | Admitting: Emergency Medicine

## 2017-01-23 ENCOUNTER — Other Ambulatory Visit: Payer: Self-pay

## 2017-01-23 ENCOUNTER — Encounter (HOSPITAL_BASED_OUTPATIENT_CLINIC_OR_DEPARTMENT_OTHER): Payer: Self-pay | Admitting: Emergency Medicine

## 2017-01-23 DIAGNOSIS — K0889 Other specified disorders of teeth and supporting structures: Secondary | ICD-10-CM | POA: Insufficient documentation

## 2017-01-23 DIAGNOSIS — Z5321 Procedure and treatment not carried out due to patient leaving prior to being seen by health care provider: Secondary | ICD-10-CM | POA: Insufficient documentation

## 2017-01-23 NOTE — ED Notes (Signed)
Pt left after triage

## 2017-01-23 NOTE — ED Notes (Signed)
Called to room x 1. No answer in lobby.

## 2017-01-23 NOTE — ED Triage Notes (Addendum)
Patient reports bilateral left and right lower jaw pain x several weeks.  Reports several fractured teeth

## 2017-01-27 ENCOUNTER — Emergency Department (HOSPITAL_COMMUNITY)
Admission: EM | Admit: 2017-01-27 | Discharge: 2017-01-27 | Disposition: A | Discharge status: Home / Self Care | Payer: Self-pay | Attending: Emergency Medicine | Admitting: Emergency Medicine

## 2017-01-27 ENCOUNTER — Other Ambulatory Visit: Payer: Self-pay

## 2017-01-27 ENCOUNTER — Encounter (HOSPITAL_COMMUNITY): Payer: Self-pay | Admitting: Emergency Medicine

## 2017-01-27 DIAGNOSIS — Z79899 Other long term (current) drug therapy: Secondary | ICD-10-CM | POA: Insufficient documentation

## 2017-01-27 DIAGNOSIS — F17210 Nicotine dependence, cigarettes, uncomplicated: Secondary | ICD-10-CM | POA: Insufficient documentation

## 2017-01-27 DIAGNOSIS — R600 Localized edema: Secondary | ICD-10-CM | POA: Insufficient documentation

## 2017-01-27 DIAGNOSIS — K029 Dental caries, unspecified: Secondary | ICD-10-CM | POA: Insufficient documentation

## 2017-01-27 MED ORDER — NAPROXEN 500 MG PO TABS: 500.0000 mg | ORAL_TABLET | Freq: Two times a day (BID) | ORAL | 0 refills | Status: AC | PRN

## 2017-01-27 MED ORDER — KETOROLAC TROMETHAMINE 30 MG/ML IJ SOLN
30.0000 mg | Freq: Once | INTRAMUSCULAR | Status: AC
  Administered 2017-01-27: 30 mg via INTRAMUSCULAR
  Filled 2017-01-27: qty 1

## 2017-01-27 MED ORDER — PENICILLIN V POTASSIUM 500 MG PO TABS: 500.0000 mg | ORAL_TABLET | Freq: Four times a day (QID) | ORAL | 0 refills | Status: AC

## 2017-01-27 NOTE — ED Provider Notes (Signed)
[facility] Provider Note   CSN: [id number] Arrival date & time: 01/27/17  2027     History   Chief Complaint Chief Complaint  Patient presents with  . Dental Abscess    HPI [name] is a 37 y.o. female presenting to the ED complaining of dental pain located right lower mouth. Pt states pain began "a while ago," worsening the past few days, described as throbbing and worse with eating. Pt reports assoc facial swelling. Has taken ibuprofen with mild relief of symptoms. Denies difficulty swallowing or breathing, fever, chills, N/V, or other symptoms today. Does not have a local dentist. Smokes cigarettes daily.   The history is provided by the patient.    Past Medical History:  Diagnosis Date  . Anxiety   . Attention deficit disorder   . Depression   . Lynch syndrome     Patient Active Problem List   Diagnosis Date Noted  . Menorrhagia 12/29/2012  . Dyspepsia 12/29/2012  . Abdominal pain, other specified site 12/29/2012  . Loss of weight 12/29/2012  . Major depressive disorder, recurrent (HCC) 01/19/2011    Past Surgical History:  Procedure Laterality Date  . TUBAL LIGATION      OB History    No data available       Home Medications    Prior to Admission medications   Medication Sig Start Date End Date Taking? Authorizing Provider  ALPRAZolam Prudy Feeler(XANAX) 1 MG tablet Take 1 mg by mouth at bedtime as needed for anxiety.    [provider]  dicyclomine (BENTYL) 20 MG tablet Take 1 tablet (20 mg total) by mouth 2 (two) times daily. 11/16/13   Toy Cookeyocherty, Megan, MD  HYDROcodone-acetaminophen (NORCO) 5-325 MG tablet Take 1 tablet by mouth every 6 (six) hours as needed for severe pain. 02/15/15   Street, BirnamwoodMercedes, PA-C  naproxen (NAPROSYN) 500 MG tablet Take 1 tablet (500 mg total) by mouth 2 (two) times daily as needed. 01/27/17   Edell Mesenbrink, [place] N, PA-C  Oxycodone HCl 10 MG TABS Take by mouth.    [provider]    penicillin v potassium (VEETID) 500 MG tablet Take 1 tablet (500 mg total) by mouth 4 (four) times daily for 7 days. 01/27/17 02/03/17  Breasia Karges, [place] N, PA-C    Family History Family History  Problem Relation Age of Onset  . Cancer Father   . Colon polyps Sister   . Cancer Maternal Aunt   . Cancer Maternal Uncle     Social History Social History   Tobacco Use  . Smoking status: Current Every Day Smoker    Packs/day: 1.00    Years: 4.00    Pack years: 4.00    Types: Cigarettes  . Smokeless tobacco: Never Used  Substance Use Topics  . Alcohol use: No    Alcohol/week: 0.0 oz  . Drug use: No     Allergies   Patient has no known allergies.   Review of Systems Review of Systems  Constitutional: Negative for fever.  HENT: Positive for dental problem and facial swelling. Negative for trouble swallowing and voice change.   Respiratory: Negative for stridor.      Physical Exam Updated Vital Signs BP 117/90 (BP Location: Right Arm)   Pulse 79   Temp 98.7 F (37.1 C) (Oral)   Resp 16   Ht 5\' 1"  (1.549 m)   Wt 56.7 kg (125 lb)   SpO2 100%   BMI 23.62 kg/m  Physical Exam  Constitutional: She appears well-developed and well-nourished. No distress.  HENT:  Head: Normocephalic and atraumatic.  Mouth/Throat: Uvula is midline. No trismus in the jaw. No uvula swelling. No posterior oropharyngeal edema or posterior oropharyngeal erythema.  Poor Dentition throughout.  Tenderness along right lower teeth.  No significant gingival erythema or edema noted.  No fluctuant abscess.  Some edema noted over right mandible region, without fluctuant mass.  No edema or tenderness underneath tongue.  Uvula midline, no trismus.  No evidence of peritonsillar abscess or deep space infection.  Tolerating secretions.  Eyes: Conjunctivae are normal.  Neck: Normal range of motion. Neck supple. No tracheal deviation present.  Cardiovascular: Normal rate and intact distal pulses.   Pulmonary/Chest: Effort normal.  Lymphadenopathy:    She has no cervical adenopathy.  Psychiatric: She has a normal mood and affect. Her behavior is normal.  Nursing note and vitals reviewed.    ED Treatments / Results  Labs (all labs ordered are listed, but only abnormal results are displayed) Labs Reviewed - No data to display  EKG  EKG Interpretation None       Radiology No results found.  Procedures Procedures (including critical care time)  Medications Ordered in ED Medications  ketorolac (TORADOL) 30 MG/ML injection 30 mg (not administered)     Initial Impression / Assessment and Plan / ED Course  I have reviewed the triage vital signs and the nursing notes.  Pertinent labs & imaging results that were available during my care of the patient were reviewed by me and considered in my medical decision making (see chart for details).     Patient with dental caries and pain.  Some edema noted to right face, however no gross abscess.  Exam unconcerning for peritonsillar abscess, Ludwig's angina or spread of infection.  VSS, afebrile, tolerating secretions. Will treat with penicillin and pain medicine.  Urged patient to follow-up with dentist. Pt safe for discharge.  Discussed results, findings, treatment and follow up. Patient advised of return precautions. Patient verbalized understanding and agreed with plan.  Final Clinical Impressions(s) / ED Diagnoses   Final diagnoses:  Dental caries    ED Discharge Orders        Ordered    naproxen (NAPROSYN) 500 MG tablet  2 times daily PRN     01/27/17 2204    penicillin v potassium (VEETID) 500 MG tablet  4 times daily     01/27/17 2204       Ilea Hilton, [place] N, [place] 01/27/17 2209    Rolan Bucco, MD 01/27/17 2312
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
## 2017-01-27 NOTE — Discharge Instructions (Signed)
Please read instructions below. °Take the antibiotic, Penicillin V, 4 times per day until they are gone. °You can take Naproxen up to 2 times per day with meals, as needed for pain. °Schedule an appointment with a dentist, using the dental resource guide attached. °Return to the ER for difficulty swallowing or breathing, fever, or new or worsening symptoms. ° °

## 2017-06-01 ENCOUNTER — Emergency Department (HOSPITAL_BASED_OUTPATIENT_CLINIC_OR_DEPARTMENT_OTHER)
Admission: EM | Admit: 2017-06-01 | Discharge: 2017-06-01 | Disposition: A | Discharge status: Home / Self Care | Payer: Self-pay | Attending: Emergency Medicine | Admitting: Emergency Medicine

## 2017-06-01 ENCOUNTER — Other Ambulatory Visit: Payer: Self-pay

## 2017-06-01 ENCOUNTER — Emergency Department (HOSPITAL_BASED_OUTPATIENT_CLINIC_OR_DEPARTMENT_OTHER): Payer: Self-pay

## 2017-06-01 ENCOUNTER — Encounter (HOSPITAL_BASED_OUTPATIENT_CLINIC_OR_DEPARTMENT_OTHER): Payer: Self-pay

## 2017-06-01 DIAGNOSIS — A599 Trichomoniasis, unspecified: Secondary | ICD-10-CM | POA: Insufficient documentation

## 2017-06-01 DIAGNOSIS — R109 Unspecified abdominal pain: Secondary | ICD-10-CM

## 2017-06-01 DIAGNOSIS — R1012 Left upper quadrant pain: Secondary | ICD-10-CM | POA: Insufficient documentation

## 2017-06-01 DIAGNOSIS — N83201 Unspecified ovarian cyst, right side: Secondary | ICD-10-CM | POA: Insufficient documentation

## 2017-06-01 DIAGNOSIS — R1032 Left lower quadrant pain: Secondary | ICD-10-CM | POA: Insufficient documentation

## 2017-06-01 LAB — CBC WITH DIFFERENTIAL/PLATELET
Basophils Absolute: 0 10*3/uL (ref 0.0–0.1)
Basophils Relative: 0 %
EOS PCT: 2 %
Eosinophils Absolute: 0.1 10*3/uL (ref 0.0–0.7)
HCT: 38.4 % (ref 36.0–46.0)
Hemoglobin: 13.6 g/dL (ref 12.0–15.0)
LYMPHS ABS: 2.8 10*3/uL (ref 0.7–4.0)
LYMPHS PCT: 49 %
MCH: 32.5 pg (ref 26.0–34.0)
MCHC: 35.4 g/dL (ref 30.0–36.0)
MCV: 91.6 fL (ref 78.0–100.0)
MONOS PCT: 5 %
Monocytes Absolute: 0.3 10*3/uL (ref 0.1–1.0)
Neutro Abs: 2.5 10*3/uL (ref 1.7–7.7)
Neutrophils Relative %: 44 %
PLATELETS: 293 10*3/uL (ref 150–400)
RBC: 4.19 MIL/uL (ref 3.87–5.11)
RDW: 12.7 % (ref 11.5–15.5)
WBC: 5.7 10*3/uL (ref 4.0–10.5)

## 2017-06-01 LAB — COMPREHENSIVE METABOLIC PANEL
ALBUMIN: 4.2 g/dL (ref 3.5–5.0)
ALT: 9 U/L — ABNORMAL LOW (ref 14–54)
AST: 17 U/L (ref 15–41)
Alkaline Phosphatase: 38 U/L (ref 38–126)
Anion gap: 10 (ref 5–15)
BUN: 9 mg/dL (ref 6–20)
CHLORIDE: 103 mmol/L (ref 101–111)
CO2: 23 mmol/L (ref 22–32)
CREATININE: 0.96 mg/dL (ref 0.44–1.00)
Calcium: 8.8 mg/dL — ABNORMAL LOW (ref 8.9–10.3)
GFR calc non Af Amer: >60 mL/min (ref >60)
GLUCOSE: 89 mg/dL (ref 65–99)
Potassium: 3.7 mmol/L (ref 3.5–5.1)
SODIUM: 136 mmol/L (ref 135–145)
Total Bilirubin: 0.9 mg/dL (ref 0.3–1.2)
Total Protein: 7.3 g/dL (ref 6.5–8.1)

## 2017-06-01 LAB — URINALYSIS, MICROSCOPIC (REFLEX)

## 2017-06-01 LAB — WET PREP, GENITAL
Sperm: NONE SEEN
Yeast Wet Prep HPF POC: NONE SEEN

## 2017-06-01 LAB — PREGNANCY, URINE: Preg Test, Ur: NEGATIVE

## 2017-06-01 LAB — URINALYSIS, ROUTINE W REFLEX MICROSCOPIC
Bilirubin Urine: NEGATIVE
Glucose, UA: NEGATIVE mg/dL
Hgb urine dipstick: NEGATIVE
Ketones, ur: NEGATIVE mg/dL
Nitrite: NEGATIVE
PROTEIN: NEGATIVE mg/dL
Specific Gravity, Urine: 1.015 (ref 1.005–1.030)
pH: 7.5 (ref 5.0–8.0)

## 2017-06-01 LAB — LIPASE, BLOOD: Lipase: 30 U/L (ref 11–51)

## 2017-06-01 IMAGING — CT CT ABD-PELV W/ CM
2 of 4 series · 15 of 46 positions shown, 17 images · IV contrast (APPLIED)
Comparison: CT [DATE]

CLINICAL DATA: 37-year-old with chronic lower abdominal pain.
Unintended weight loss. Dysuria. History of Lynch syndrome.

EXAM:
CT ABDOMEN AND PELVIS WITH CONTRAST
TECHNIQUE: Multidetector CT imaging of the abdomen and pelvis was performed
using the standard protocol following bolus administration of
intravenous contrast.
CONTRAST:  100mL [DX] IOPAMIDOL ([DX]) INJECTION 61%

[Series 2: axial st · axial · 0.61mm/px · z∈[-618,-223]mm · 12 of 91 slices shown, 14 images]
[im 8/91  soft-tissue]
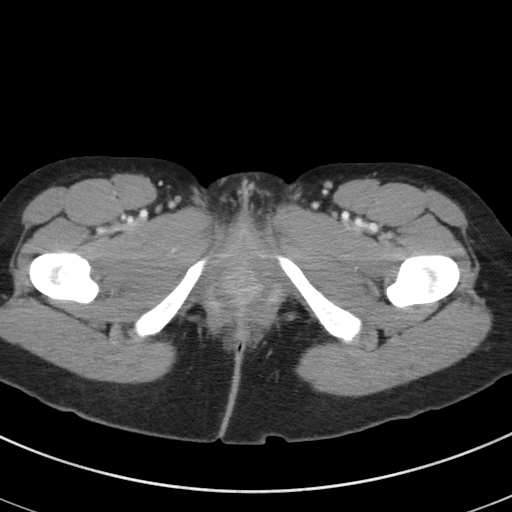
[im 8/91  bone]
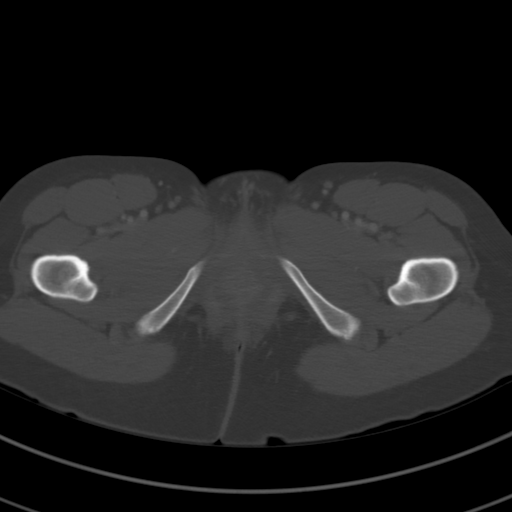
[im 15/91  soft-tissue]
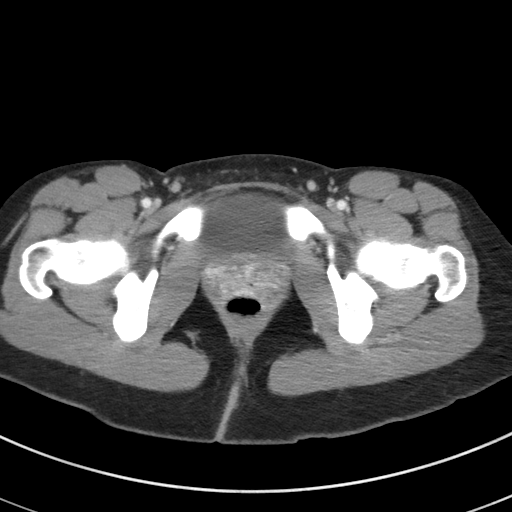
[im 22/91  soft-tissue]
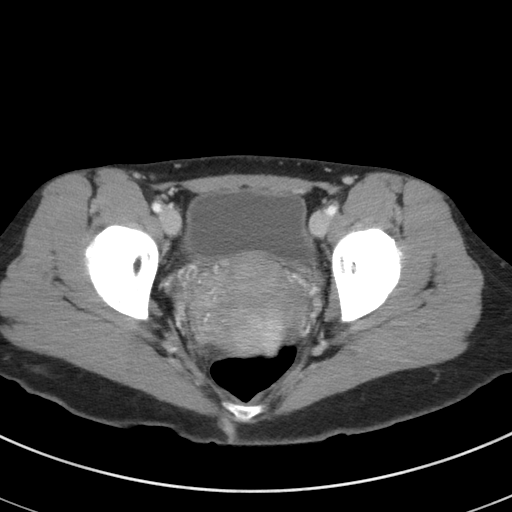
[im 29/91  soft-tissue]
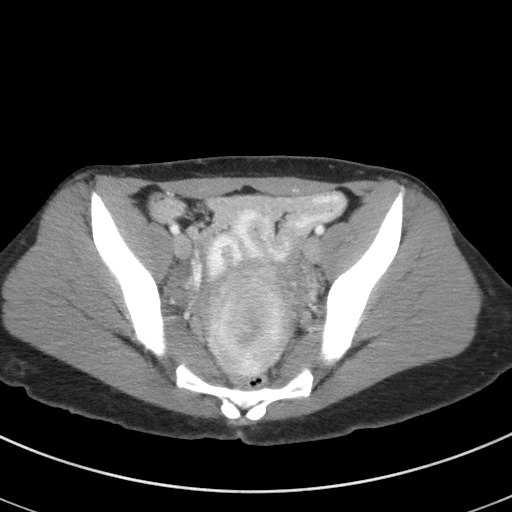
[im 37/91  soft-tissue]
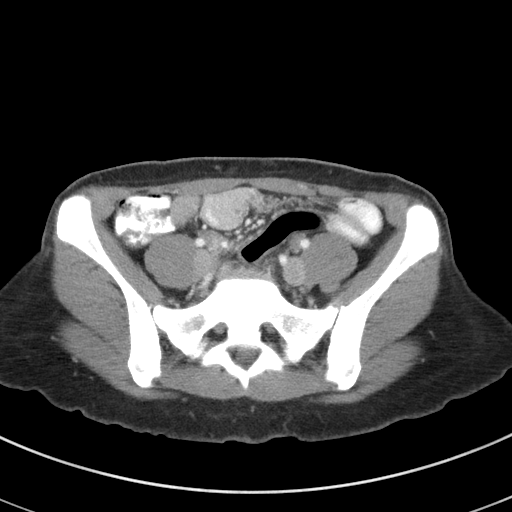
[im 44/91  soft-tissue]
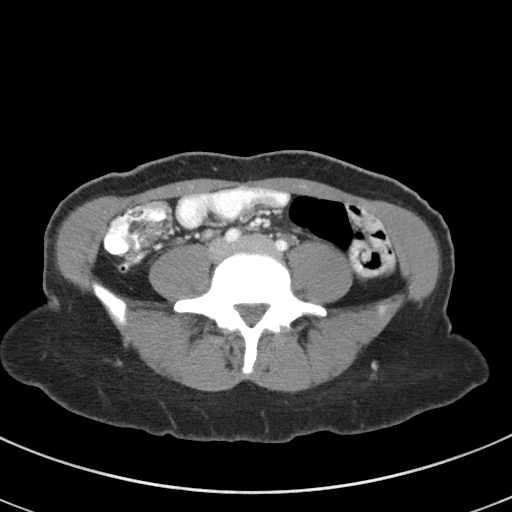
[im 51/91  soft-tissue]
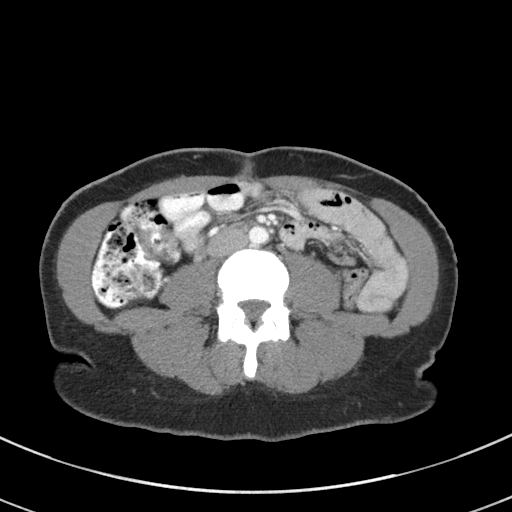
[im 58/91  soft-tissue]
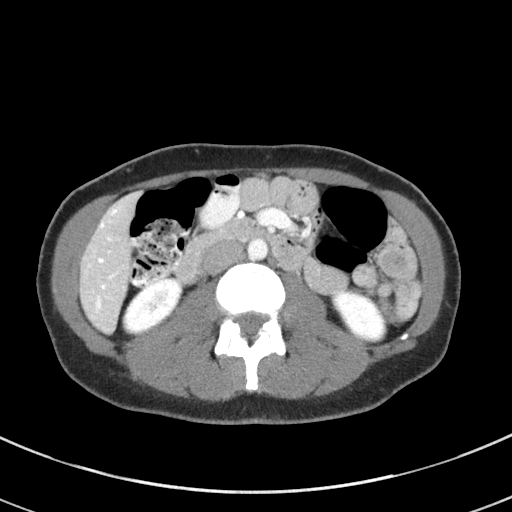
[im 65/91  soft-tissue]
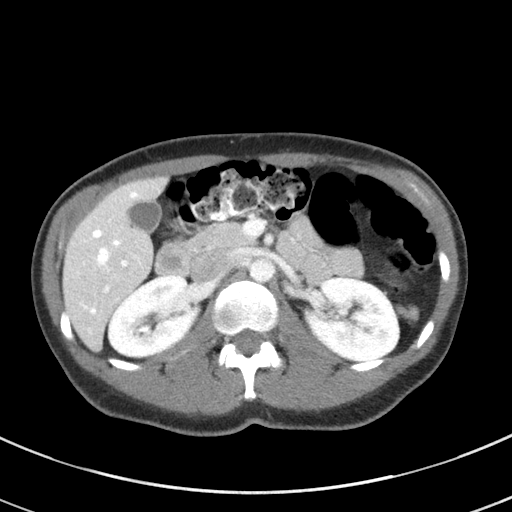
[im 65/91  bone]
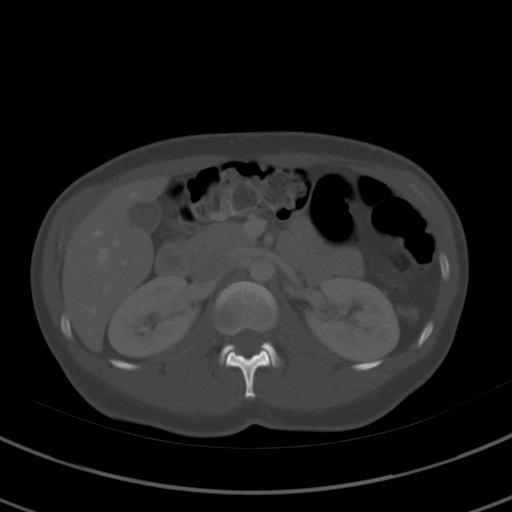
[im 73/91  soft-tissue]
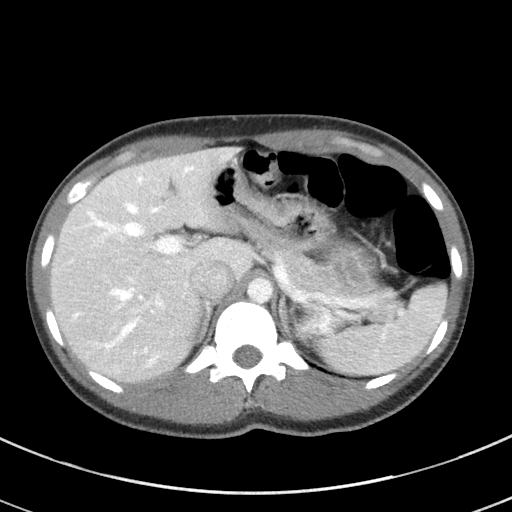
[im 80/91  soft-tissue]
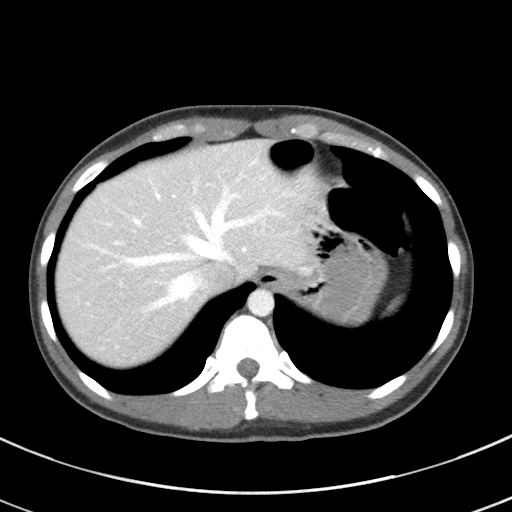
[im 87/91  soft-tissue]
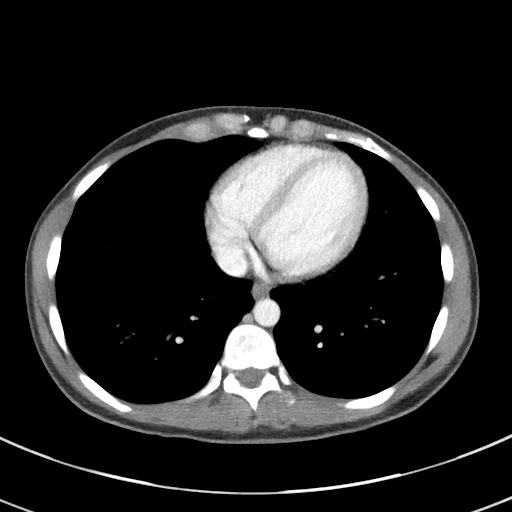

[Series 5: coronal st · coronal · 0.63mm/px · 3 of 69 slices shown]
[im 23/69  soft-tissue]
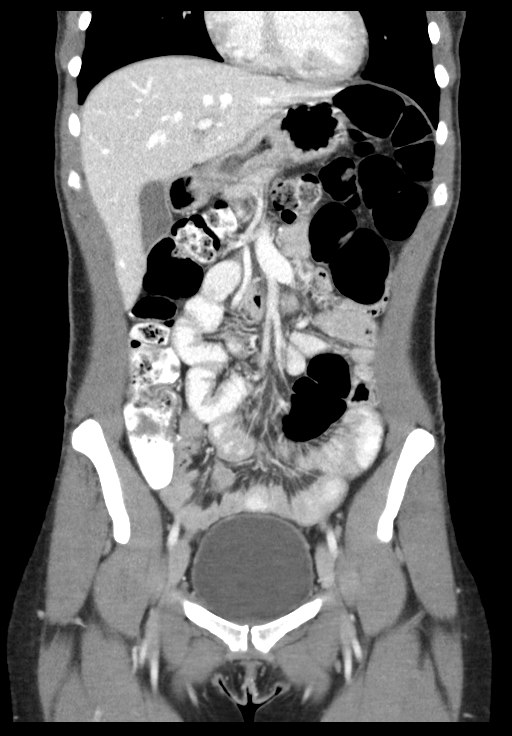
[im 31/69  soft-tissue]
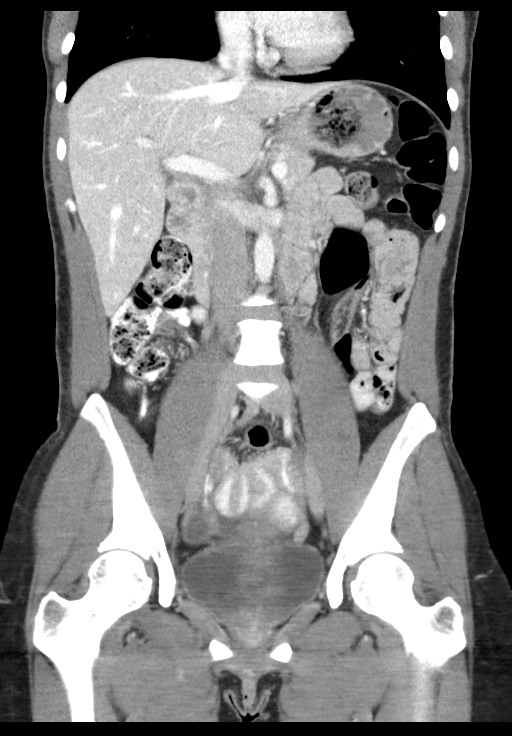
[im 38/69  soft-tissue]
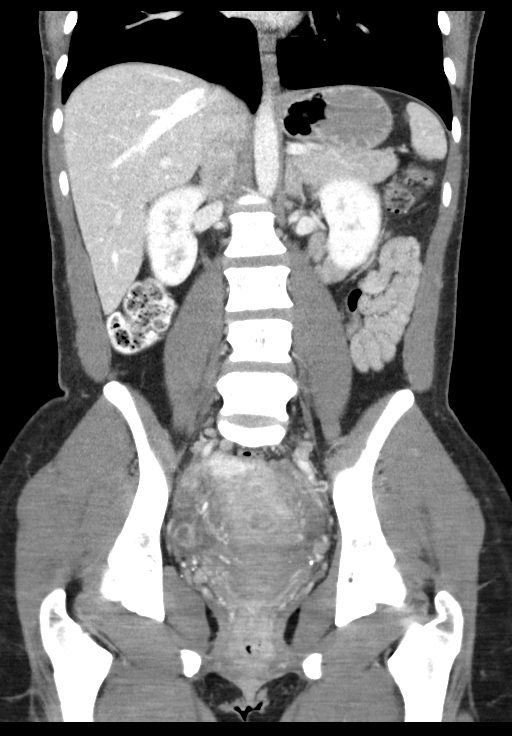

[15 of 46 positions shown; findings below may reference images not displayed]

FINDINGS: Lower chest: Stable 4 mm nodule in the right middle lobe on sequence
4, image 3. This is likely benign based on the stability since [DX].
No pleural effusions.

Hepatobiliary: Normal appearance of the liver, gallbladder and
portal venous system. Subtle low-density structures in the liver
probably represent incidental findings, such as small cysts. At
least 2 of these appear stable.

Pancreas: Normal appearance of the pancreas without inflammation or
duct dilatation.

Spleen: Normal appearance of spleen without enlargement.

Adrenals/Urinary Tract: Normal adrenal glands. Small cyst in the
right kidney lower pole is similar to the previous examination.
Limited evaluation for kidney stones on this postcontrast
examination. Normal appearance of the urinary bladder. No suspicious
renal lesions.

Stomach/Bowel: Stomach is within normal limits. Appendix appears
normal. No evidence of bowel wall thickening, distention, or
inflammatory changes.

Vascular/Lymphatic: No significant vascular findings are present. No
enlarged abdominal or pelvic lymph nodes.

Reproductive: Retro positioned uterus. There is a 2.7 cm low-density
structure near the right adnexa which probably represents a small
right ovarian cyst. No gross abnormality to the left ovary/adnexa.
Trace fluid in the pelvis.

Other: Trace fluid in the pelvis is likely physiologic.

Musculoskeletal: No acute abnormality.
IMPRESSION: No acute abnormality in the abdomen or pelvis.

Probable small right ovarian cyst. Trace fluid in the pelvis is
likely physiologic in a patient of this age.

## 2017-06-01 IMAGING — DX DG CHEST 2V
2 series · 2 of 2 positions shown · non-contrast
Comparison: None.

CLINICAL DATA: Cough and chest pain

EXAM:
CHEST - 2 VIEW

[chest pa]
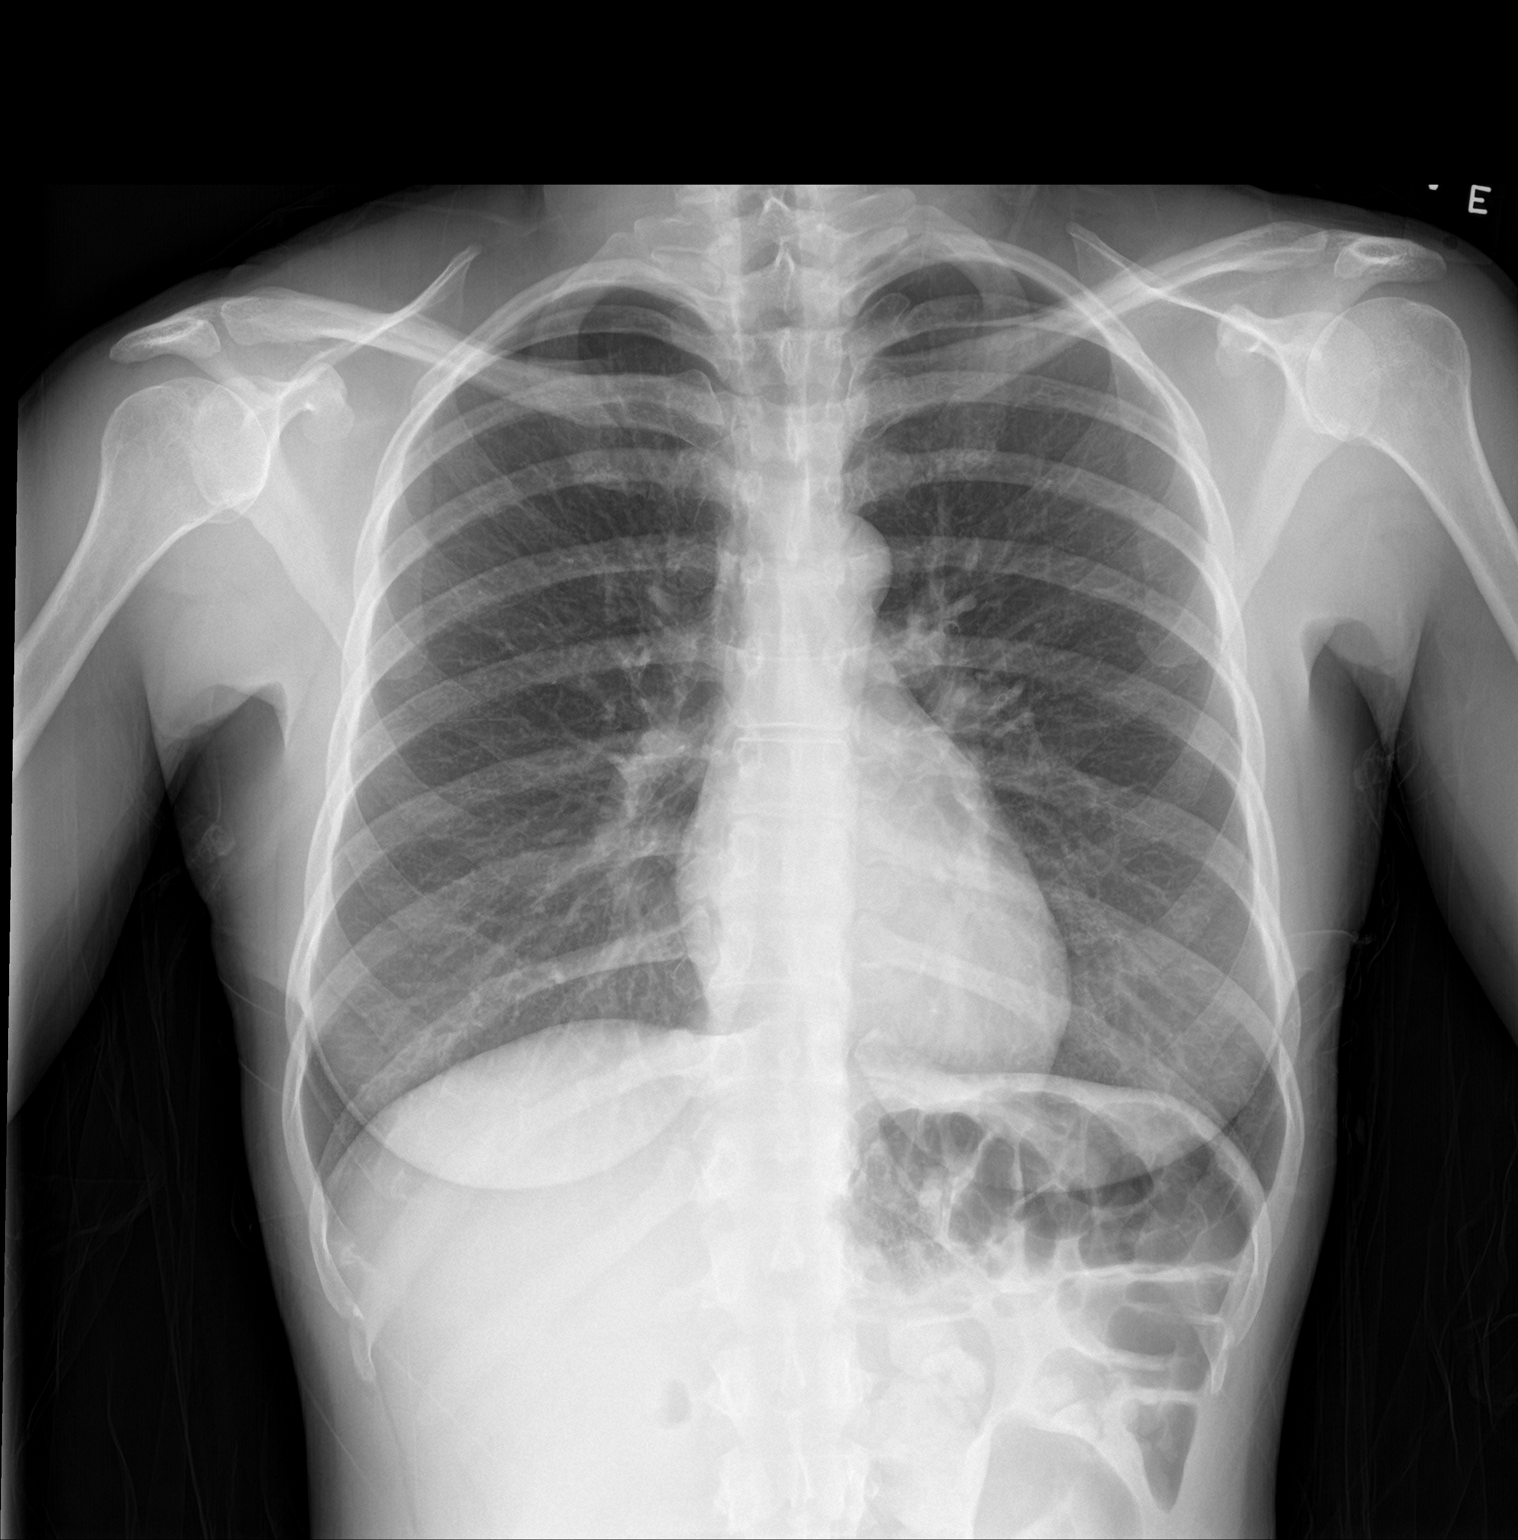

[chest lat]
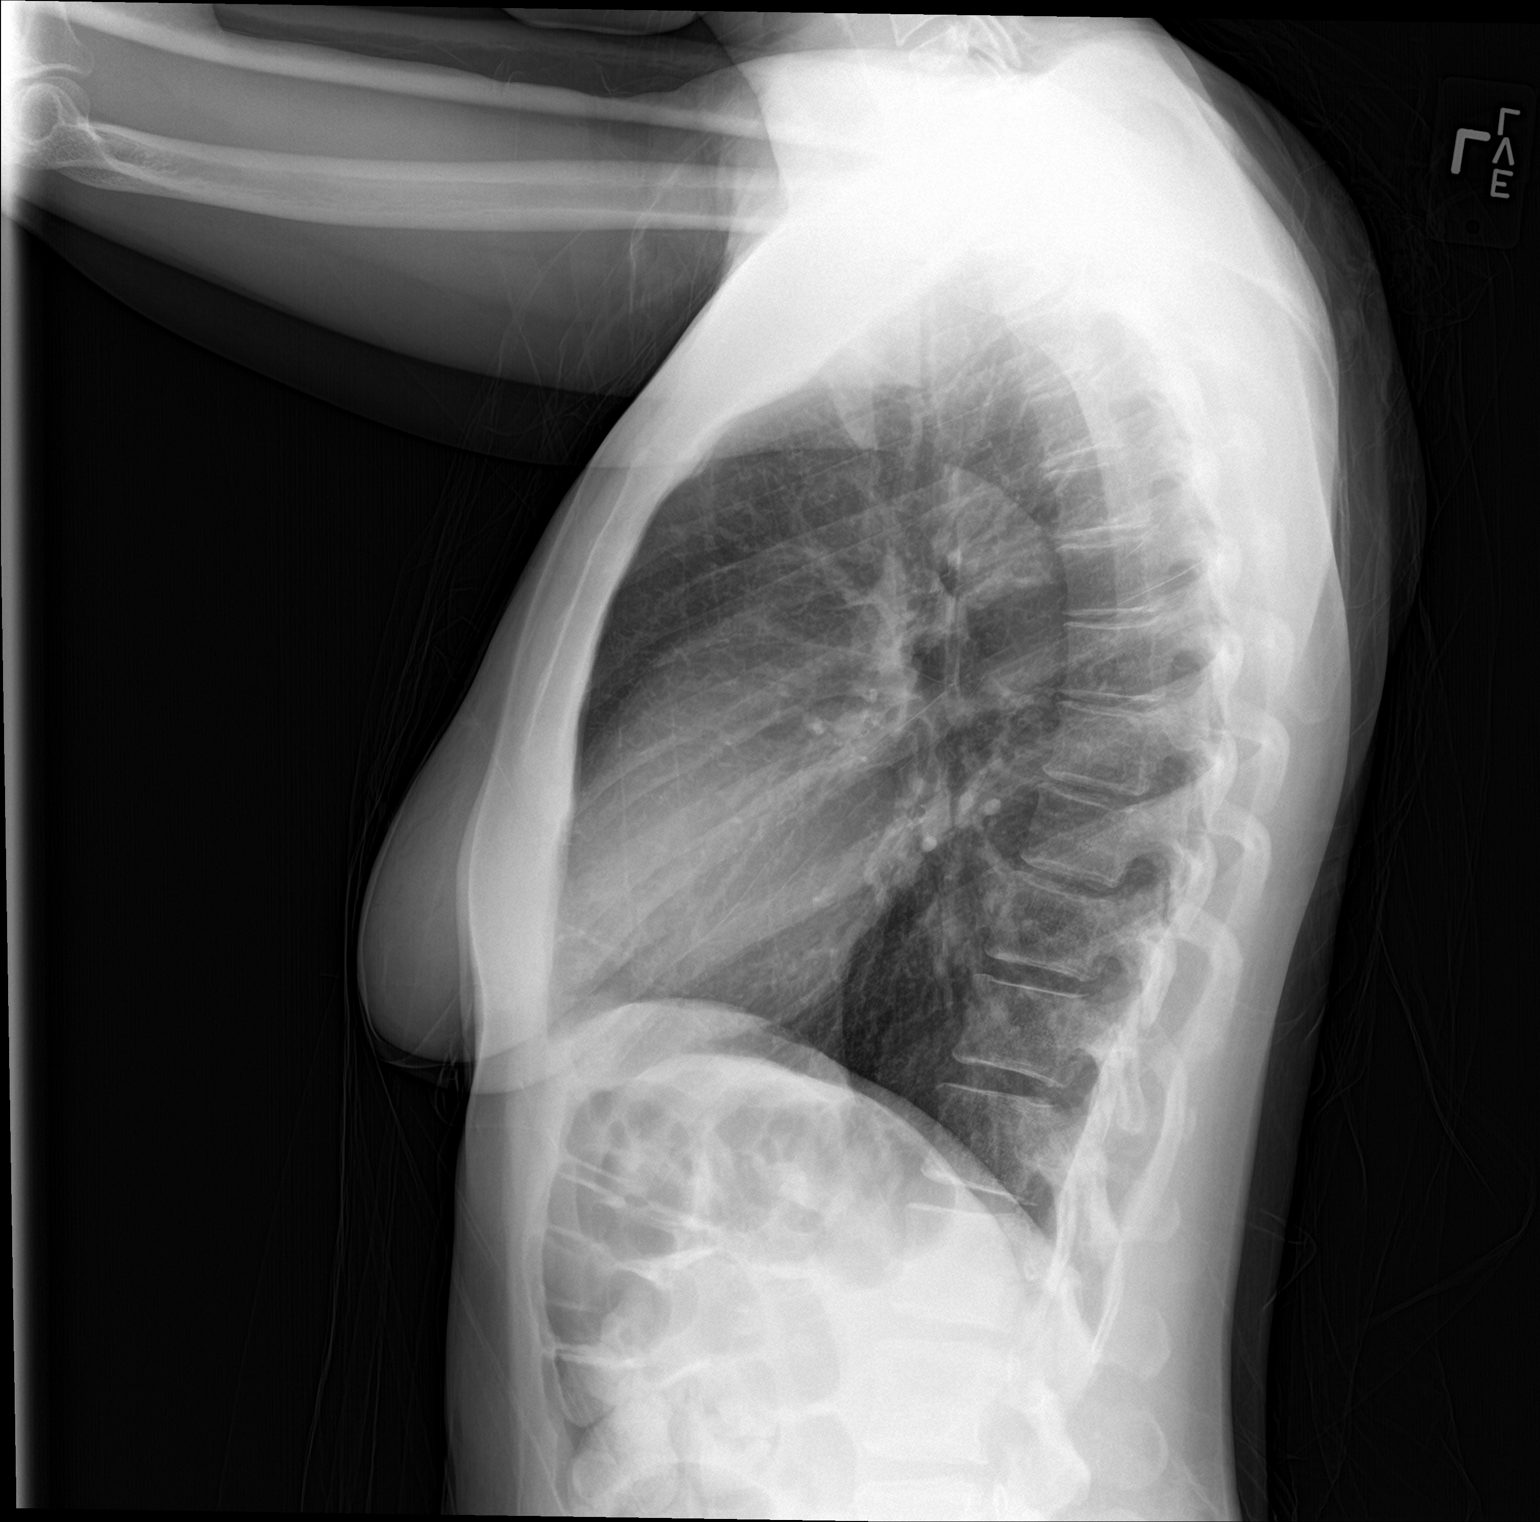

[2 of 2 positions shown; findings below may reference images not displayed]

FINDINGS: The heart size and mediastinal contours are within normal limits.
Both lungs are clear. The visualized skeletal structures are
unremarkable.
IMPRESSION: No active cardiopulmonary disease.

## 2017-06-01 MED ORDER — METRONIDAZOLE 500 MG PO TABS
500.0000 mg | ORAL_TABLET | Freq: Once | ORAL | Status: DC
  Filled 2017-06-01: qty 1

## 2017-06-01 MED ORDER — IOPAMIDOL (ISOVUE-300) INJECTION 61%
100.0000 mL | Freq: Once | INTRAVENOUS | Status: AC | PRN
  Administered 2017-06-01: 100 mL via INTRAVENOUS

## 2017-06-01 MED ORDER — METRONIDAZOLE 50 MG/ML ORAL SUSPENSION: 500.0000 mg | Freq: Two times a day (BID) | ORAL | 0 refills | Status: AC

## 2017-06-01 MED ORDER — CEFTRIAXONE SODIUM 250 MG IJ SOLR
250.0000 mg | Freq: Once | INTRAMUSCULAR | Status: AC
Start: 2017-06-01 — End: 2017-06-01
  Administered 2017-06-01: 250 mg via INTRAMUSCULAR
  Filled 2017-06-01: qty 250

## 2017-06-01 MED ORDER — AZITHROMYCIN 250 MG PO TABS
1000.0000 mg | ORAL_TABLET | Freq: Once | ORAL | Status: AC
  Administered 2017-06-01: 1000 mg via ORAL
  Filled 2017-06-01: qty 4

## 2017-06-01 NOTE — ED Notes (Signed)
Patient returned from CT; no acute distress noted.  

## 2017-06-01 NOTE — ED Notes (Signed)
NAD at this time. Pt is stable and going home.  

## 2017-06-01 NOTE — ED Notes (Signed)
Patient called for nurse; patient on stretcher tearful and complaining of abd pain; patient unable to give pain a number from 0-10, just states " it hurts", Patient got up from stretcher and sat on stool near sink leaning forward; patient states lower abd cramping. Denies NVD.

## 2017-06-01 NOTE — ED Notes (Signed)
Patient transported to CT 

## 2017-06-01 NOTE — ED Provider Notes (Signed)
[facility] EMERGENCY DEPARTMENT Provider Note   CSN: [id number] Arrival date & time: 06/01/17  1209     History   Chief Complaint Chief Complaint  Patient presents with  . Abdominal Pain    HPI [name] is a 37 y.o. female.  HPI  37 year old female presents with a chief complaint of left-sided abdominal pain and weight loss.  She states she is been having essentially continuous left-sided abdominal pain that waxes and wanes for about 3 years.  Has also lost about 35 pounds during those 3 years unintentionally.  She endorses a chronic cough for several months that she states is from smoking.  She also endorses dysuria but states that is also chronic (>3 months). Feels somewhat similar to prior STI.  Denies any vaginal discharge.  She has irregular menstrual cycles.  She has a strong family history of early colon cancer and had a colonoscopy 4 years ago at [facility] and was told she needed to c pain is rated as a 3.  Ome back for another one but has not.  She denies any bloody stools but has chronic constipation.  Past Medical History:  Diagnosis Date  . Anxiety   . Attention deficit disorder   . Depression   . Lynch syndrome     Patient Active Problem List   Diagnosis Date Noted  . Menorrhagia 12/29/2012  . Dyspepsia 12/29/2012  . Abdominal pain, other specified site 12/29/2012  . Loss of weight 12/29/2012  . Major depressive disorder, recurrent (HCC) 01/19/2011    Past Surgical History:  Procedure Laterality Date  . TUBAL LIGATION       OB History   None      Home Medications    Prior to Admission medications   Medication Sig Start Date End Date Taking? Authorizing Provider  ALPRAZolam Prudy Feeler) 1 MG tablet Take 1 mg by mouth at bedtime as needed for anxiety.    [provider]  dicyclomine (BENTYL) 20 MG tablet Take 1 tablet (20 mg total) by mouth 2 (two) times daily. 11/16/13   Toy Cookey, MD  HYDROcodone-acetaminophen (NORCO) 5-325  MG tablet Take 1 tablet by mouth every 6 (six) hours as needed for severe pain. 02/15/15   Street, Mercedes, PA-C  metroNIDAZOLE (FLAGYL) 50 mg/ml oral suspension Take 10 mLs (500 mg total) by mouth 2 (two) times daily for 7 days. 06/01/17 06/08/17  Pricilla Loveless, MD  naproxen (NAPROSYN) 500 MG tablet Take 1 tablet (500 mg total) by mouth 2 (two) times daily as needed. 01/27/17   Robinson, [place] N, PA-C  Oxycodone HCl 10 MG TABS Take by mouth.    [provider]    Family History Family History  Problem Relation Age of Onset  . Cancer Father   . Colon polyps Sister   . Cancer Maternal Aunt   . Cancer Maternal Uncle     Social History Social History   Tobacco Use  . Smoking status: Current Every Day Smoker    Packs/day: 1.00    Years: 4.00    Pack years: 4.00    Types: Cigarettes  . Smokeless tobacco: Never Used  Substance Use Topics  . Alcohol use: No    Alcohol/week: 0.0 oz  . Drug use: No     Allergies   Patient has no known allergies.   Review of Systems Review of Systems  Constitutional: Positive for unexpected weight change. Negative for fever.  Respiratory: Positive for cough. Negative for shortness of breath.  Gastrointestinal: Positive for abdominal pain and constipation.  Genitourinary: Positive for dysuria and menstrual problem (irregular). Negative for vaginal bleeding, vaginal discharge and vaginal pain.  Musculoskeletal: Negative for back pain.  All other systems reviewed and are negative.    Physical Exam Updated Vital Signs BP 115/75 (BP Location: Left Arm)   Pulse 72   Temp 98.2 F (36.8 C) (Oral)   Resp 18   Ht  (1.549 m)   Wt 53.1 kg (117 lb)   LMP 04/25/2017   SpO2 100%   BMI 22.11 kg/m   Physical Exam  Constitutional: She is oriented to person, place, and time. She appears well-developed and well-nourished.  Non-toxic appearance. She does not appear ill. No distress.  HENT:  Head: Normocephalic and atraumatic.  Right  Ear: External ear normal.  Left Ear: External ear normal.  Nose: Nose normal.  Eyes: Right eye exhibits no discharge. Left eye exhibits no discharge.  Cardiovascular: Normal rate, regular rhythm and normal heart sounds.  Pulmonary/Chest: Effort normal and breath sounds normal. She has no wheezes. She has no rales.  Abdominal: Soft. There is tenderness in the left upper quadrant and left lower quadrant.  Genitourinary: Tenderness: scant. Vaginal discharge found.  Neurological: She is alert and oriented to person, place, and time.  Skin: Skin is warm and dry.  Nursing note and vitals reviewed.    ED Treatments / Results  Labs (all labs ordered are listed, but only abnormal results are displayed) Labs Reviewed  WET PREP, GENITAL - Abnormal; Notable for the following components:      Result Value   Trich, Wet Prep PRESENT (*)    Clue Cells Wet Prep HPF POC PRESENT (*)    WBC, Wet Prep HPF POC MANY (*)    All other components within normal limits  URINALYSIS, ROUTINE W REFLEX MICROSCOPIC - Abnormal; Notable for the following components:   Leukocytes, UA TRACE (*)    All other components within normal limits  URINALYSIS, MICROSCOPIC (REFLEX) - Abnormal; Notable for the following components:   Bacteria, UA FEW (*)    All other components within normal limits  COMPREHENSIVE METABOLIC PANEL - Abnormal; Notable for the following components:   Calcium 8.8 (*)    ALT 9 (*)    All other components within normal limits  URINE CULTURE  PREGNANCY, URINE  LIPASE, BLOOD  CBC WITH DIFFERENTIAL/PLATELET  RPR  HIV ANTIBODY (ROUTINE TESTING)  GC/CHLAMYDIA PROBE AMP ([place]) NOT AT [facility]    EKG None  Radiology Dg Chest 2 View  Result Date: 06/01/2017 CLINICAL DATA:  Cough and chest pain EXAM: CHEST - 2 VIEW COMPARISON:  None. FINDINGS: The heart size and mediastinal contours are within normal limits. Both lungs are clear. The visualized skeletal structures are unremarkable. IMPRESSION:  No active cardiopulmonary disease. Electronically Signed   By: Deatra Robinson M.D.   On: 06/01/2017 17:46   Ct Abdomen Pelvis W Contrast  Result Date: 06/01/2017 CLINICAL DATA:  37 year old with chronic lower abdominal pain. Unintended weight loss. Dysuria. History of Lynch syndrome. EXAM: CT ABDOMEN AND PELVIS WITH CONTRAST TECHNIQUE: Multidetector CT imaging of the abdomen and pelvis was performed using the standard protocol following bolus administration of intravenous contrast. CONTRAST:  ISOVUE-300 IOPAMIDOL (ISOVUE-300) INJECTION 61% COMPARISON:  CT 11/16/2013 FINDINGS: Lower chest: Stable 4 mm nodule in the right middle lobe on sequence 4, image 3. This is likely benign based on the stability since 2015. No pleural effusions. Hepatobiliary: Normal appearance of the liver,  gallbladder and portal venous system. Subtle low-density structures in the liver probably represent incidental findings, such as small cysts. At least 2 of these appear stable. Pancreas: Normal appearance of the pancreas without inflammation or duct dilatation. Spleen: Normal appearance of spleen without enlargement. Adrenals/Urinary Tract: Normal adrenal glands. Small cyst in the right kidney lower pole is similar to the previous examination. Limited evaluation for kidney stones on this postcontrast examination. Normal appearance of the urinary bladder. No suspicious renal lesions. Stomach/Bowel: Stomach is within normal limits. Appendix appears normal. No evidence of bowel wall thickening, distention, or inflammatory changes. Vascular/Lymphatic: No significant vascular findings are present. No enlarged abdominal or pelvic lymph nodes. Reproductive: Retro positioned uterus. There is a 2.7 cm low-density structure near the right adnexa which probably represents a small right ovarian cyst. No gross abnormality to the left ovary/adnexa. Trace fluid in the pelvis. Other: Trace fluid in the pelvis is likely physiologic. Musculoskeletal:  No acute abnormality. IMPRESSION: No acute abnormality in the abdomen or pelvis. Probable small right ovarian cyst. Trace fluid in the pelvis is likely physiologic in a patient of this age. Electronically Signed   By: Richarda Overlie M.D.   On: 06/01/2017 18:13    Procedures Procedures (including critical care time)  Medications Ordered in ED Medications  cefTRIAXone (ROCEPHIN) injection 250 mg (250 mg Intramuscular Given 06/01/17 1611)  azithromycin (ZITHROMAX) tablet 1,000 mg (1,000 mg Oral Given 06/01/17 1610)  iopamidol (ISOVUE-300) 61 % injection 100 mL (100 mLs Intravenous Contrast Given 06/01/17 1743)     Initial Impression / Assessment and Plan / ED Course  I have reviewed the triage vital signs and the nursing notes.  Pertinent labs & imaging results that were available during my care of the patient were reviewed by me and considered in my medical decision making (see chart for details).     Unclear why the patient has had chronic left sided abdominal pain.  She is found to have a right ovarian cyst but this is not correlate with her symptoms.  Her dysuria for multiple months is probably STI related given the trichomonas seen in UA.  Pelvic exam performed with scant discharge but she does not have any lower abdominal pain.  Low concern for PID.  She was given Rocephin and azithromycin given prior STI and high concern for STI.  HIV and RPR will be sent.  Other labs unremarkable.  No clear cause for her weight loss.  She was unable to tolerate metronidazole pill because it is too large so she will be given liquid at home.  Counseled on no EtOH.  Discharge home with return precautions and she was recommended follow-up with her PCP, Dr. Mayford Knife.  Final Clinical Impressions(s) / ED Diagnoses   Final diagnoses:  Left sided abdominal pain  Right ovarian cyst  Trichomonosis    ED Discharge Orders        Ordered    metroNIDAZOLE (FLAGYL) 50 mg/ml oral suspension  2 times daily     06/01/17  1847       Pricilla Loveless, MD 06/02/17 [phone]

## 2017-06-01 NOTE — ED Triage Notes (Signed)
C/o left side abd pain x "years"-states she is having dysuria and constipation-NAD-steady gait

## 2017-06-02 LAB — URINE CULTURE

## 2017-06-02 LAB — GC/CHLAMYDIA PROBE AMP (~~LOC~~) NOT AT ARMC
CHLAMYDIA, DNA PROBE: NEGATIVE
Neisseria Gonorrhea: NEGATIVE

## 2017-06-02 LAB — HIV ANTIBODY (ROUTINE TESTING W REFLEX): HIV SCREEN 4TH GENERATION: NONREACTIVE

## 2017-06-02 LAB — RPR: RPR Ser Ql: NONREACTIVE

## 2018-08-08 ENCOUNTER — Other Ambulatory Visit: Payer: Self-pay | Admitting: *Deleted

## 2018-08-08 DIAGNOSIS — Z20822 Contact with and (suspected) exposure to covid-19: Secondary | ICD-10-CM

## 2018-08-09 NOTE — Addendum Note (Signed)
Addended by: Yousof Alderman M on: 08/09/2018 08:52 PM   Modules accepted: Orders  

## 2018-08-11 ENCOUNTER — Telehealth: Payer: Self-pay | Admitting: Family Medicine

## 2018-08-11 ENCOUNTER — Other Ambulatory Visit: Payer: Self-pay

## 2018-08-11 DIAGNOSIS — Z20822 Contact with and (suspected) exposure to covid-19: Secondary | ICD-10-CM

## 2018-08-11 LAB — NOVEL CORONAVIRUS, NAA

## 2018-08-11 LAB — SPECIMEN STATUS REPORT

## 2018-08-11 NOTE — Telephone Encounter (Signed)
Called the patient to ask her to go to the [facility] to be tested for Covid 19 due to her test not being received from the mobile unit

## 2018-08-16 LAB — NOVEL CORONAVIRUS, NAA: SARS-CoV-2, NAA: NOT DETECTED

## 2020-05-27 DIAGNOSIS — Z1159 Encounter for screening for other viral diseases: Secondary | ICD-10-CM | POA: Diagnosis not present

## 2020-05-27 DIAGNOSIS — R5383 Other fatigue: Secondary | ICD-10-CM | POA: Diagnosis not present

## 2020-05-27 DIAGNOSIS — Z131 Encounter for screening for diabetes mellitus: Secondary | ICD-10-CM | POA: Diagnosis not present

## 2020-05-27 DIAGNOSIS — Z1152 Encounter for screening for COVID-19: Secondary | ICD-10-CM | POA: Diagnosis not present

## 2020-05-27 DIAGNOSIS — M542 Cervicalgia: Secondary | ICD-10-CM | POA: Diagnosis not present

## 2020-05-27 DIAGNOSIS — Z114 Encounter for screening for human immunodeficiency virus [HIV]: Secondary | ICD-10-CM | POA: Diagnosis not present

## 2020-05-27 DIAGNOSIS — Z79899 Other long term (current) drug therapy: Secondary | ICD-10-CM | POA: Diagnosis not present

## 2020-05-27 DIAGNOSIS — M792 Neuralgia and neuritis, unspecified: Secondary | ICD-10-CM | POA: Diagnosis not present

## 2020-05-27 DIAGNOSIS — Z0000 Encounter for general adult medical examination without abnormal findings: Secondary | ICD-10-CM | POA: Diagnosis not present

## 2020-08-20 ENCOUNTER — Other Ambulatory Visit: Payer: Self-pay | Admitting: Pain Medicine

## 2020-08-20 DIAGNOSIS — M542 Cervicalgia: Secondary | ICD-10-CM

## 2020-09-07 ENCOUNTER — Ambulatory Visit
Admission: RE | Admit: 2020-09-07 | Discharge: 2020-09-07 | Disposition: A | Discharge status: Home / Self Care | Payer: BC Managed Care – PPO | Source: Ambulatory Visit | Attending: Pain Medicine | Admitting: Pain Medicine

## 2020-09-07 ENCOUNTER — Other Ambulatory Visit: Payer: Self-pay

## 2020-09-07 DIAGNOSIS — M542 Cervicalgia: Secondary | ICD-10-CM

## 2020-09-07 DIAGNOSIS — M4802 Spinal stenosis, cervical region: Secondary | ICD-10-CM | POA: Diagnosis not present

## 2020-09-07 IMAGING — MR MR CERVICAL SPINE W/O CM
4 of 5 series · 30 of 48 positions shown · non-contrast
Comparison: None.

CLINICAL DATA: Neck pain M 54.2. Left side neck pain radiating into
left shoulder Numbness in left hand Constipation and bladder changes
Fell [DATE]

EXAM:
MRI CERVICAL SPINE WITHOUT CONTRAST
TECHNIQUE: Multiplanar, multisequence MR imaging of the cervical spine was
performed. No intravenous contrast was administered.

[Series 3: T1 · sagittal · 3.0mm · 0.41mm/px · 7 of 15 slices shown]
[im 1/15]
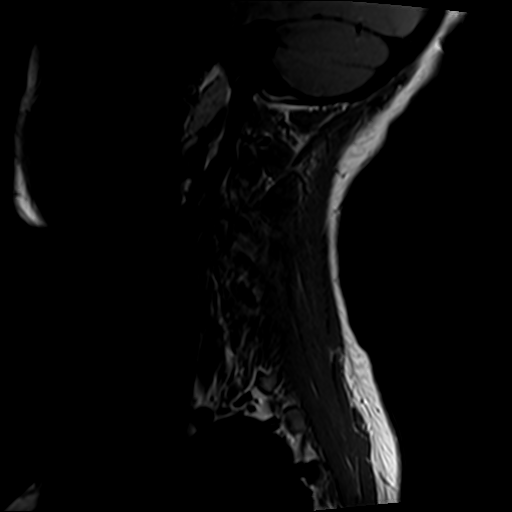
[im 3/15]
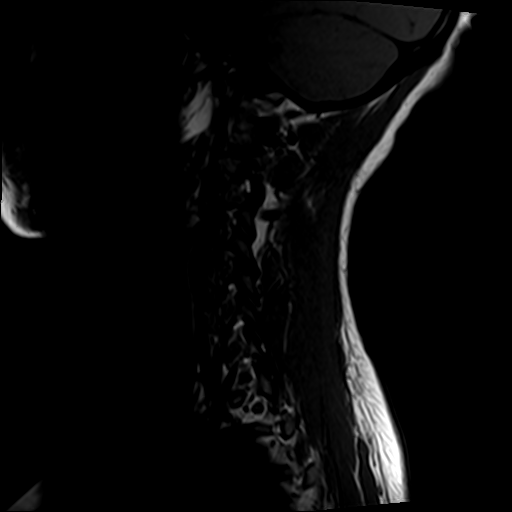
[im 5/15]
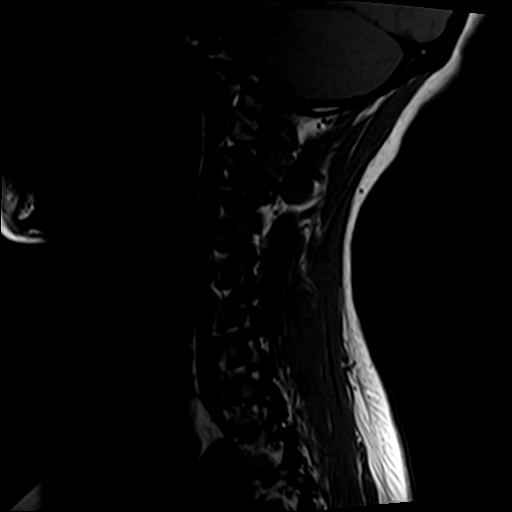
[im 8/15]
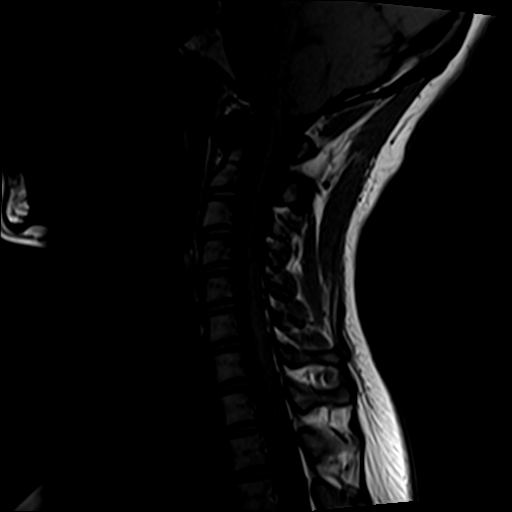
[im 10/15]
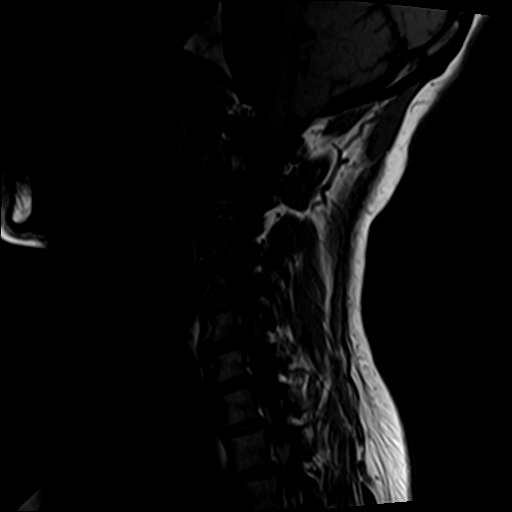
[im 12/15]
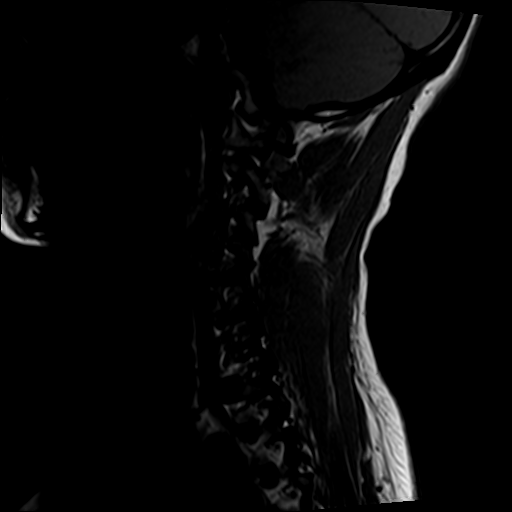
[im 15/15]
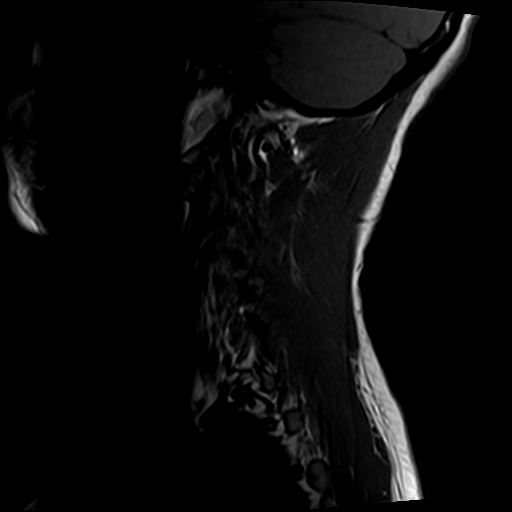

[Series 4: T2 · sagittal · 3.0mm · 0.82mm/px · 7 of 15 slices shown (1 of 2)]
[im 1/15]
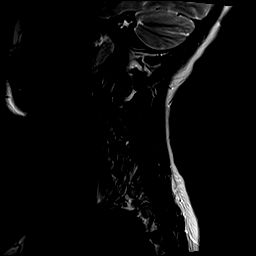
[im 3/15]
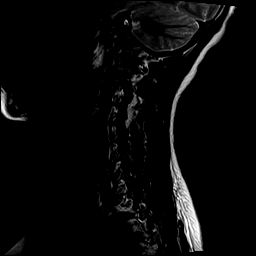
[im 5/15]
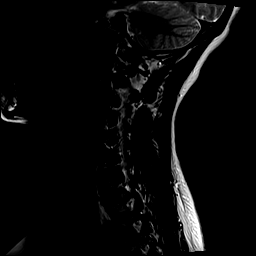
[im 8/15]
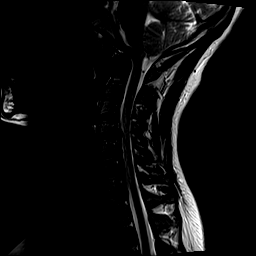
[im 10/15]
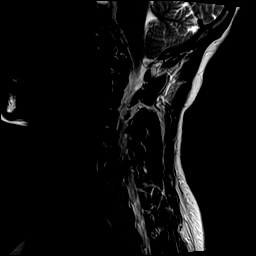
[im 12/15]
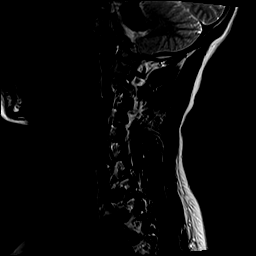
[im 15/15]
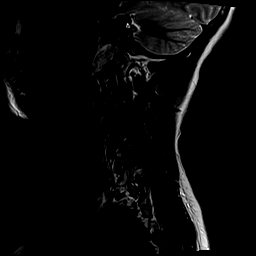

[Series 5: tir sag · sagittal · 3.0mm · 0.41mm/px · 7 of 15 slices shown]
[im 1/15]
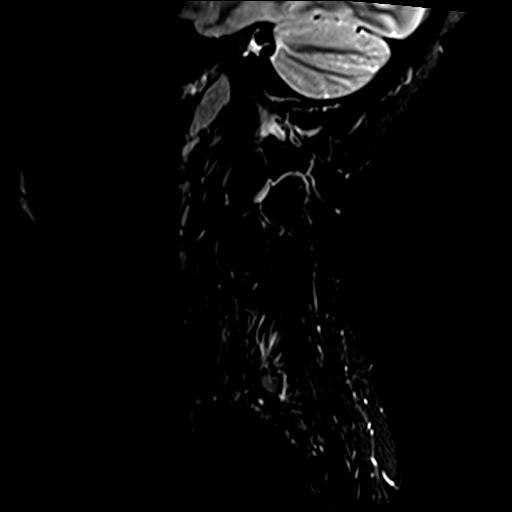
[im 3/15]
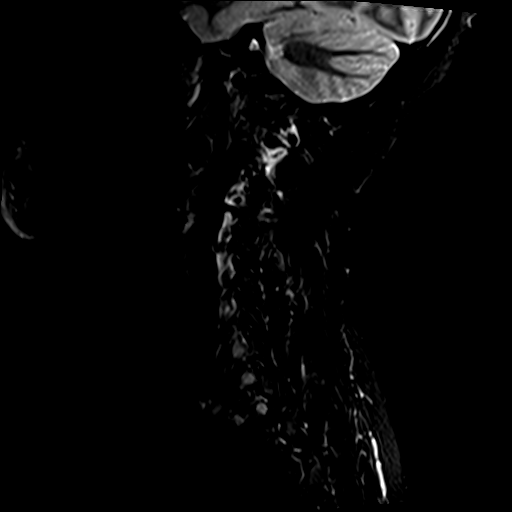
[im 5/15]
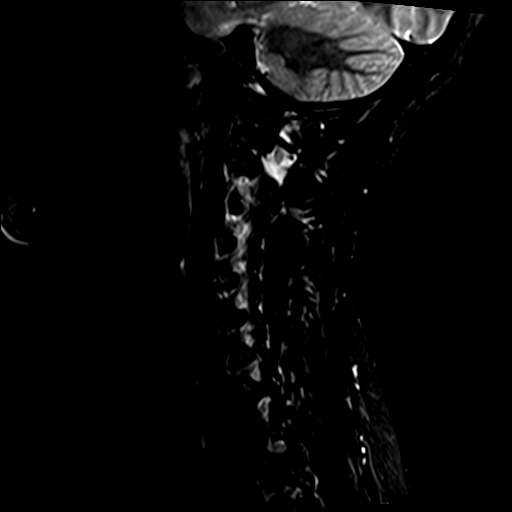
[im 7/15]
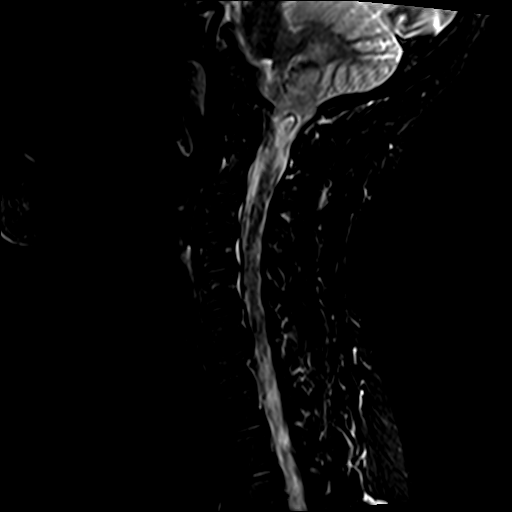
[im 9/15]
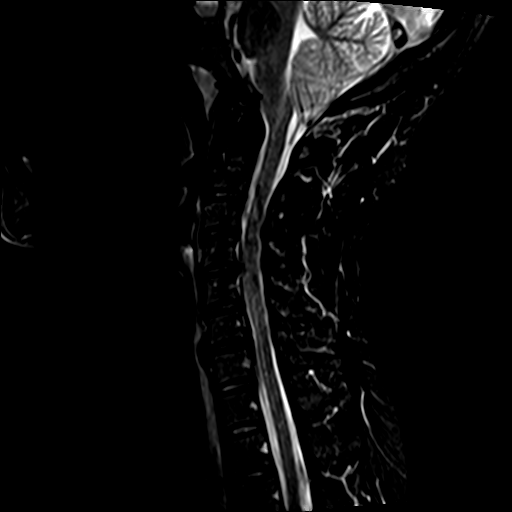
[im 11/15]
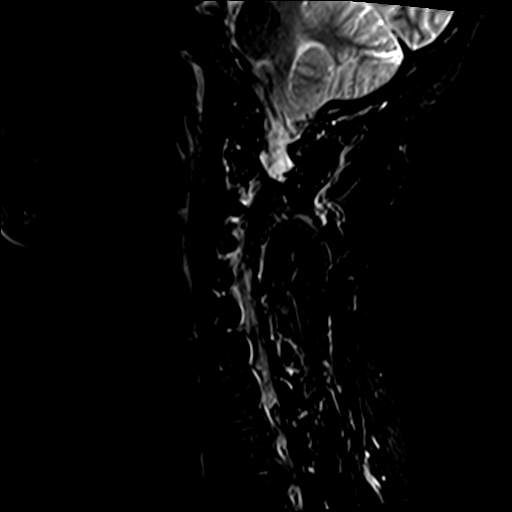
[im 13/15]
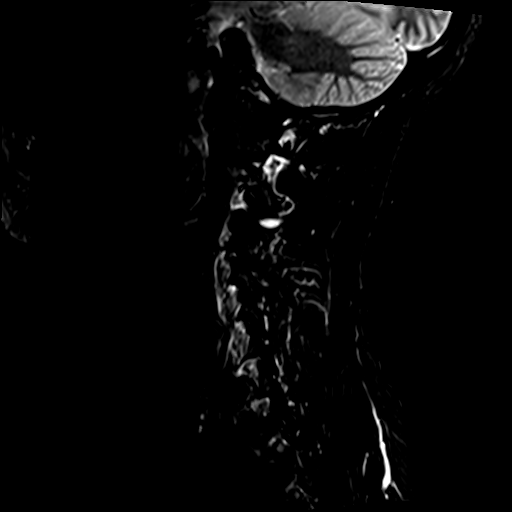

[Series 7: T2 · axial · 3.0mm · 0.70mm/px · z∈[-71,+21]mm · 9 of 23 slices shown (2 of 2)]
[im 1/23]
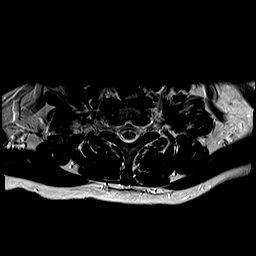
[im 5/23]
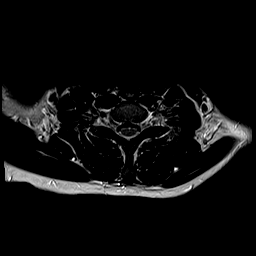
[im 7/23]
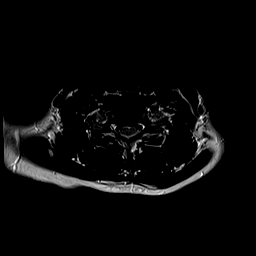
[im 11/23]
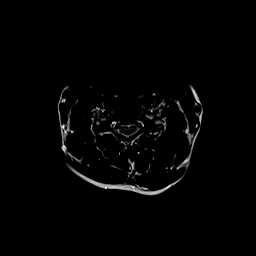
[im 13/23]
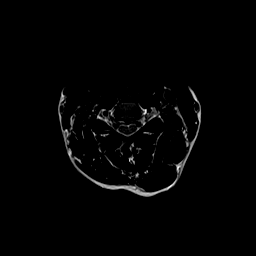
[im 17/23]
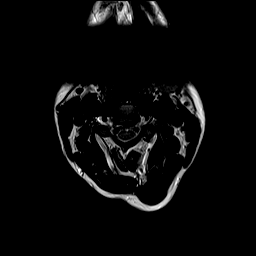
[im 19/23]
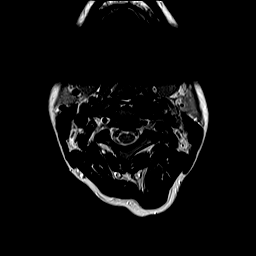
[im 21/23]
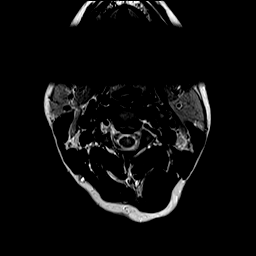
[im 23/23]
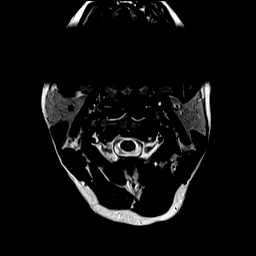

[30 of 48 positions shown; findings below may reference images not displayed]

FINDINGS: Alignment: Physiologic.

Vertebrae: No fracture, evidence of discitis, or bone lesion.

Cord: Normal signal and morphology.

Posterior Fossa, vertebral arteries, paraspinal tissues: Negative.

Disc levels:

C1-2: Unremarkable.

C2-3: Normal disc space and facet joints. There is no spinal canal
stenosis. No neural foraminal stenosis.

C3-4: Normal disc space and facet joints. There is no spinal canal
stenosis. No neural foraminal stenosis.

C4-5: Intermediate sized central disc extrusion with elements of
superior and inferior migration. Moderate spinal canal stenosis.
Indentation of the ventral spinal cord. No neural foraminal
stenosis.

C5-6: Small central disc protrusion. There is no spinal canal
stenosis. No neural foraminal stenosis.

C6-7: Small disc bulge.  There is

no spinal canal stenosis. No neural foraminal stenosis.

C7-T1: Normal disc space and facet joints. There is no spinal canal
stenosis. No neural foraminal stenosis.
IMPRESSION: 1. Moderate C4-5 spinal canal stenosis secondary to intermediate
sized central disc extrusion with elements of superior and inferior
migration. Indentation of the ventral spinal cord without cord
signal abnormality.
2. Small central disc protrusion at C5-6 without stenosis.

## 2020-09-09 DIAGNOSIS — R030 Elevated blood-pressure reading, without diagnosis of hypertension: Secondary | ICD-10-CM | POA: Diagnosis not present

## 2020-09-09 DIAGNOSIS — M5412 Radiculopathy, cervical region: Secondary | ICD-10-CM | POA: Diagnosis not present

## 2021-03-01 ENCOUNTER — Emergency Department (HOSPITAL_COMMUNITY): Payer: BC Managed Care – PPO

## 2021-03-01 ENCOUNTER — Other Ambulatory Visit: Payer: Self-pay

## 2021-03-01 ENCOUNTER — Emergency Department (HOSPITAL_COMMUNITY)
Admission: EM | Admit: 2021-03-01 | Discharge: 2021-03-01 | Disposition: A | Discharge status: Home / Self Care | Payer: BC Managed Care – PPO | Attending: Emergency Medicine | Admitting: Emergency Medicine

## 2021-03-01 ENCOUNTER — Encounter (HOSPITAL_COMMUNITY): Payer: Self-pay

## 2021-03-01 DIAGNOSIS — M25562 Pain in left knee: Secondary | ICD-10-CM | POA: Insufficient documentation

## 2021-03-01 DIAGNOSIS — W1840XA Slipping, tripping and stumbling without falling, unspecified, initial encounter: Secondary | ICD-10-CM | POA: Insufficient documentation

## 2021-03-01 DIAGNOSIS — G8929 Other chronic pain: Secondary | ICD-10-CM | POA: Insufficient documentation

## 2021-03-01 NOTE — Discharge Instructions (Addendum)
It was a pleasure taking care of you!   Your x-ray was negative in the ED.  You may continue with your prescription pain medications as prescribed. You may take over the counter 600 mg Ibuprofen every 6 hours as needed for pain for no more than 7 days.  You will be given a sleeve, wear during the day.  You may remove it at night.  You may apply ice to the affected area for up to 15 minutes at a time.  Ensure to place a barrier between your skin and the ice.  Attached is information for the on-call orthopedist group, call in the morning to set up a follow-up appointment regarding today's ED visit. Return to the Emergency Department if you are experiencing increasing/worsening pain, swelling, color change, fever, or worsening symptoms.

## 2021-03-01 NOTE — ED Triage Notes (Signed)
Patient reports that she slipped on ice on 02/20/20 and injured her let knee. Patient states she "has a knot on the knee and pain increases with ambulation."

## 2021-03-01 NOTE — ED Provider Notes (Signed)
[facility]-EMERGENCY DEPT Provider Note   CSN: [id number] Arrival date & time: 03/01/21  1712     History  No chief complaint on file.   [name] is a 41 y.o. female who presents to the ED complaining of left knee pain since 02/20/2020.  She notes at that time she slipped on ice and injured her left knee. She has been followed by an orthopedic, Dr. Everardo Pacific at [facility] for her shoulder.  She is also been followed by a neurologist.  She notes pain with ambulation.  Has associated swelling.  She has tried her prescription 10 mg oxycodone with no relief for her symptoms.  Denies color change, wound, fever, chills, left lower leg pain, ankle pain  The history is provided by the patient. No language interpreter was used.      Home Medications Prior to Admission medications   Medication Sig Start Date End Date Taking? Authorizing Provider  ALPRAZolam Prudy Feeler) 1 MG tablet Take 1 mg by mouth at bedtime as needed for anxiety.    [provider]  dicyclomine (BENTYL) 20 MG tablet Take 1 tablet (20 mg total) by mouth 2 (two) times daily. 11/16/13   Toy Cookey, MD  HYDROcodone-acetaminophen (NORCO) 5-325 MG tablet Take 1 tablet by mouth every 6 (six) hours as needed for severe pain. 02/15/15   Street, [place], PA-C  naproxen (NAPROSYN) 500 MG tablet Take 1 tablet (500 mg total) by mouth 2 (two) times daily as needed. 01/27/17   Robinson, [place] N, PA-C  Oxycodone HCl 10 MG TABS Take by mouth.    [provider]      Allergies    Hydrocodone and Shellfish allergy    Review of Systems   Review of Systems  Constitutional:  Negative for chills and fever.  Musculoskeletal:  Positive for arthralgias and joint swelling. Negative for gait problem.  Skin:  Negative for color change, rash and wound.  All other systems reviewed and are negative.  Physical Exam Updated Vital Signs BP 130/83 (BP Location: Right Arm)    Pulse 80    Temp 98.7 F (37.1 C)  (Oral)    Resp 16    Ht 5\' 1"  (1.549 m)    Wt 63.5 kg    LMP 02/15/2021    SpO2 100%    BMI 26.45 kg/m  Physical Exam Vitals and nursing note reviewed.  Constitutional:      General: She is not in acute distress.    Appearance: Normal appearance.  Eyes:     General: No scleral icterus.    Extraocular Movements: Extraocular movements intact.  Cardiovascular:     Rate and Rhythm: Normal rate.  Pulmonary:     Effort: Pulmonary effort is normal. No respiratory distress.  Musculoskeletal:     Cervical back: Neck supple.     Right knee: Normal.     Left knee: No swelling, deformity or bony tenderness. Decreased range of motion (Secondary to pain). Tenderness present over the medial joint line and lateral joint line.     Comments: Mild tenderness to palpation to superior, lateral, medial aspect of left knee. No obvious deformity, effusion, erythema, or swelling.  Decreased range of motion of the left knee secondary to pain.  Bony protrusion noted to lateral tibial plateau without overlying skin changes.  No increased warmth to left knee.  Patient able to ambulate without difficulty or assistance. Strength and sensation intact to bilateral lower extremities.   Skin:    General:  Skin is warm and dry.     Findings: No bruising, erythema or rash.  Neurological:     Mental Status: She is alert.  Psychiatric:        Behavior: Behavior normal.    ED Results / Procedures / Treatments   Labs (all labs ordered are listed, but only abnormal results are displayed) Labs Reviewed - No data to display  EKG None  Radiology DG Knee 2 Views Left  Result Date: 03/01/2021 CLINICAL DATA:  Fall, knee pain EXAM: LEFT KNEE - 1-2 VIEW COMPARISON:  None. FINDINGS: No evidence of fracture, dislocation, or joint effusion. No evidence of arthropathy or other focal bone abnormality. Soft tissues are unremarkable. IMPRESSION: Negative. Electronically Signed   By: Charlett Nose M.D.   On: 03/01/2021 18:56     Procedures Procedures    Medications Ordered in ED Medications - No data to display  ED Course/ Medical Decision Making/ A&P Clinical Course as of 03/01/21 1955  Sun Mar 01, 2021  1926 Re-evaluated and noted improvement of symptoms with treatment regimen. Discussed discharge treatment plan. Pt agreeable at this time. Pt appears safe for discharge. [SB]    Clinical Course User Index [SB] Katherene Dinino A, PA-C                           Medical Decision Making Amount and/or Complexity of Data Reviewed Radiology: ordered.   Patient with left knee pain onset 02/20/20 s/p slipping on ice.  Patient denies any new injury, trauma, fall.  On exam, patient with mild tenderness to palpation to superior, medial, lateral aspect of left knee. No edema, erythema, effusion. Sensation and pulses intact bilaterally to lower extremities.  Differential diagnosis includes fracture, dislocation, osteoarthritis, septic arthritis, muscle strain.  Imaging: I ordered imaging studies including left knee xray I independently visualized and interpreted imaging which showed: Negative for acute fracture or dislocation I agree with the radiologist interpretation   Disposition: Patient presentation suspicious for strain of left knee.  Doubt fracture, dislocation, osteoarthritis, septic arthritis at this time.  Vital signs stable, no increased warmth or redness noted to left knee.  After consideration of the diagnostic results and the patients response to treatment, I feel that the patient would benefit from discharge home.  Discussed with patient at bedside importance of following up with her orthopedist at [facility].  Also discussed RICE therapy and NSAID use.  Informed patient that she can continue with her prescription 10 mg oxycodone as prescribed.  Provided a knee sleeve in the ED.  Supportive care measures and strict return precautions discussed with patient at bedside. Pt acknowledges and verbalizes  understanding. Pt appears safe for discharge. Follow up as indicated in discharge paperwork.   This chart was dictated using voice recognition software, Dragon. Despite the best efforts of this provider to proofread and correct errors, errors may still occur which can change documentation meaning.    Final Clinical Impression(s) / ED Diagnoses Final diagnoses:  Chronic pain of left knee    Rx / DC Orders ED Discharge Orders     None         Eduardo Honor A, PA-C 03/01/21 1955    Lorre Nick, MD 03/01/21 2201

## 2021-04-07 ENCOUNTER — Other Ambulatory Visit: Payer: Self-pay | Admitting: Neurosurgery

## 2021-04-07 DIAGNOSIS — G8929 Other chronic pain: Secondary | ICD-10-CM

## 2021-04-19 ENCOUNTER — Ambulatory Visit
Admission: RE | Admit: 2021-04-19 | Discharge: 2021-04-19 | Disposition: A | Discharge status: Home / Self Care | Payer: BC Managed Care – PPO | Source: Ambulatory Visit | Attending: Neurosurgery | Admitting: Neurosurgery

## 2021-04-19 ENCOUNTER — Other Ambulatory Visit: Payer: Self-pay

## 2021-04-19 DIAGNOSIS — G8929 Other chronic pain: Secondary | ICD-10-CM

## 2021-04-19 IMAGING — MR MR KNEE*L* W/O CM
4 of 6 series · 21 of 40 positions shown · non-contrast
Comparison: X-RAY KNEE [DATE].

CLINICAL DATA: Left knee pain with swelling related to a slip and
fall at work, [DATE].

EXAM:
MRI OF THE LEFT KNEE WITHOUT CONTRAST
TECHNIQUE: Multiplanar, multisequence MR imaging of the knee was performed. No
intravenous contrast was administered.

[Series 3: T2 fat-sat · axial · 4.0mm · 0.50mm/px · z∈[-92,+13]mm · 3 of 22 slices shown (1 of 2)]
[im 1/22]
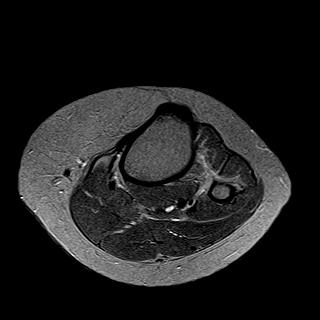
[im 11/22]
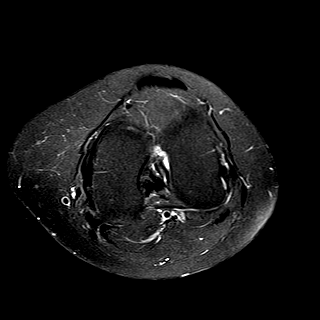
[im 22/22]
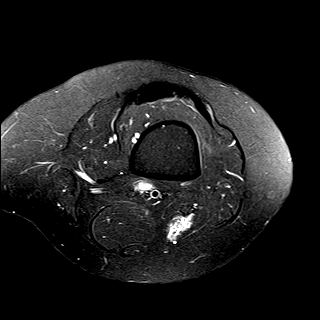

[Series 5: T2 fat-sat · coronal · 4.0mm · 0.29mm/px · 3 of 20 slices shown (2 of 2)]
[im 4/20]
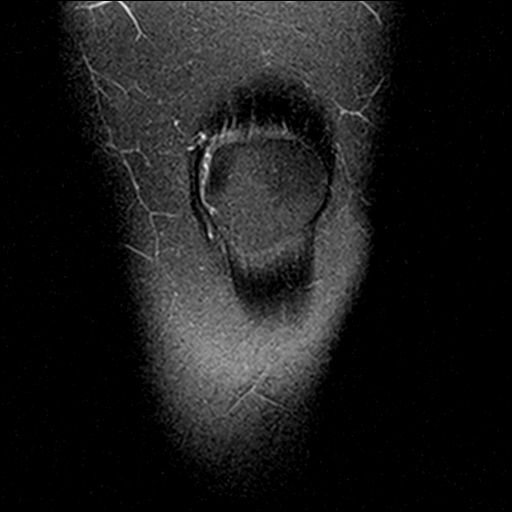
[im 12/20]
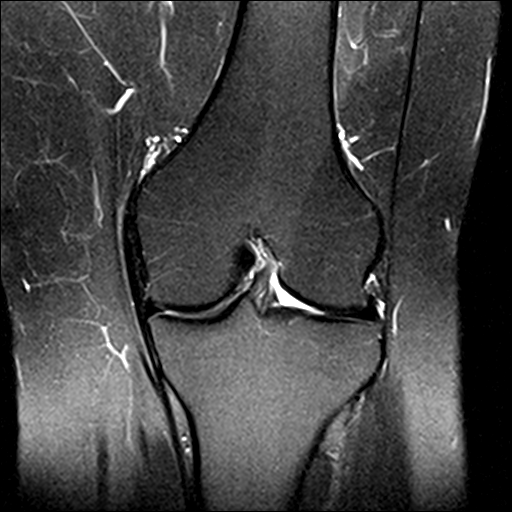
[im 20/20]
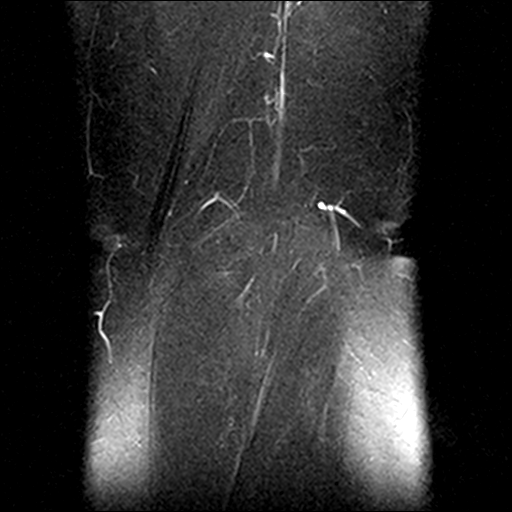

[Series 7: PD fat-sat · sagittal · 3.0mm · 0.29mm/px · 8 of 27 slices shown (1 of 2)]
[im 1/27]
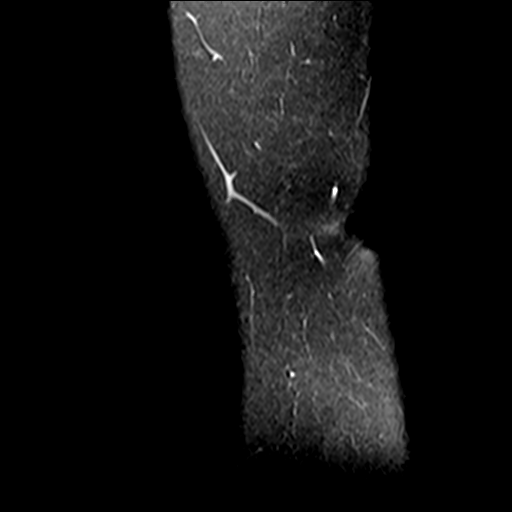
[im 4/27]
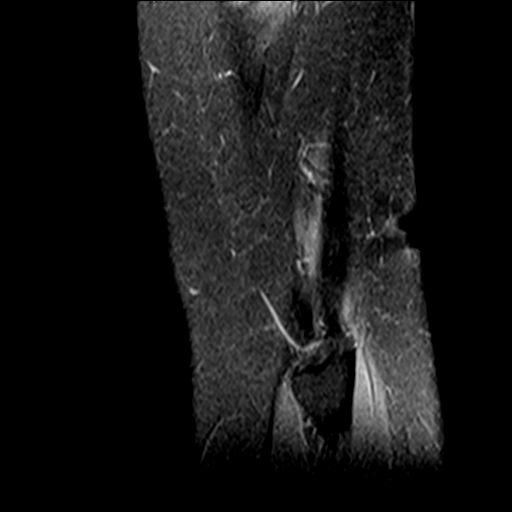
[im 8/27]
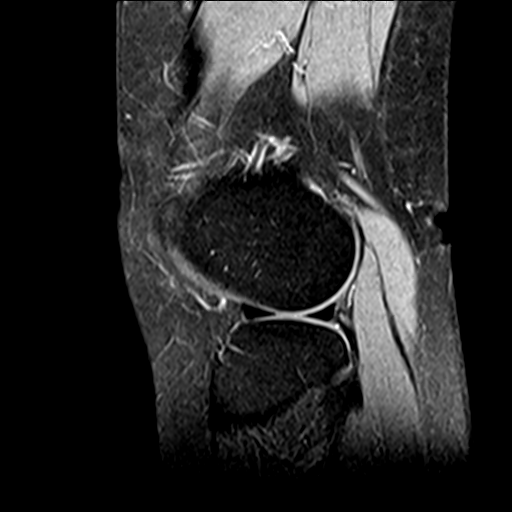
[im 12/27]
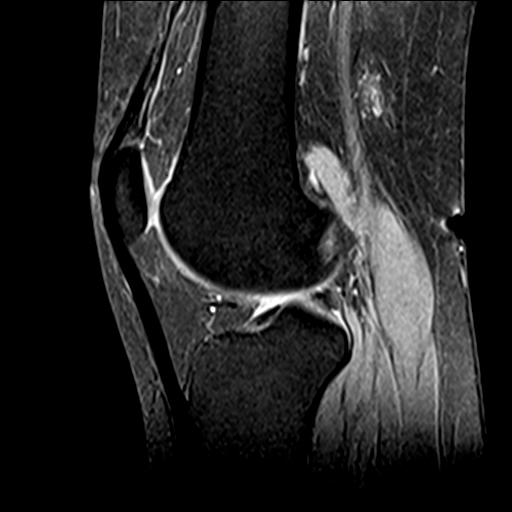
[im 15/27]
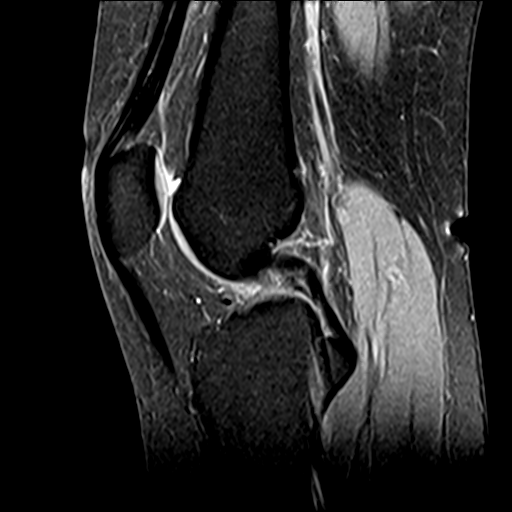
[im 19/27]
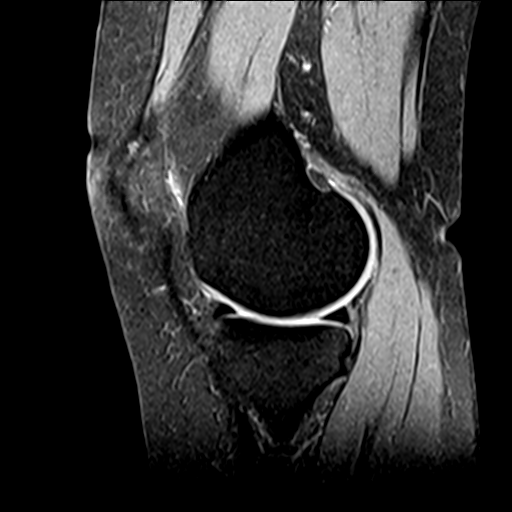
[im 23/27]
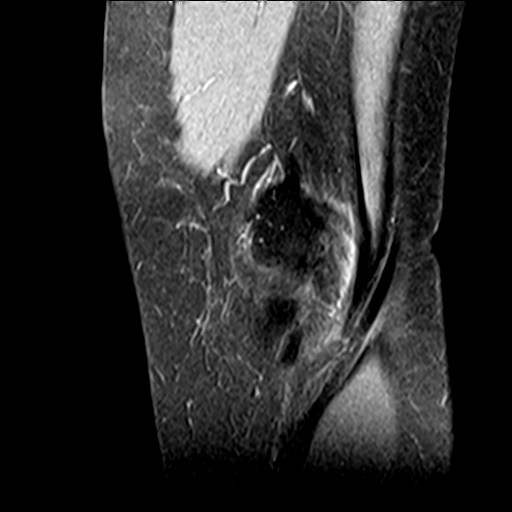
[im 27/27]
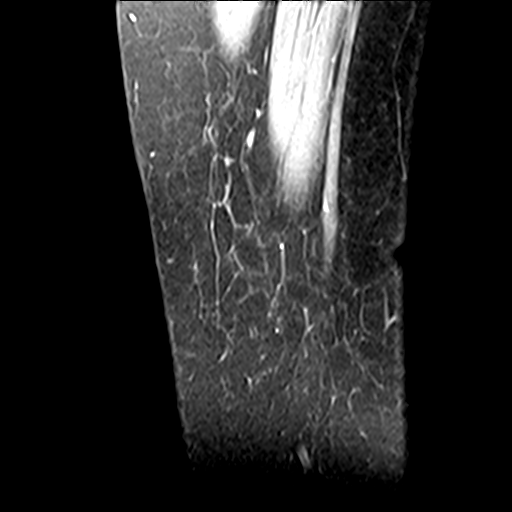

[Series 8: PD fat-sat · coronal · 3.0mm · 0.29mm/px · 7 of 28 slices shown (2 of 2)]
[im 1/28]
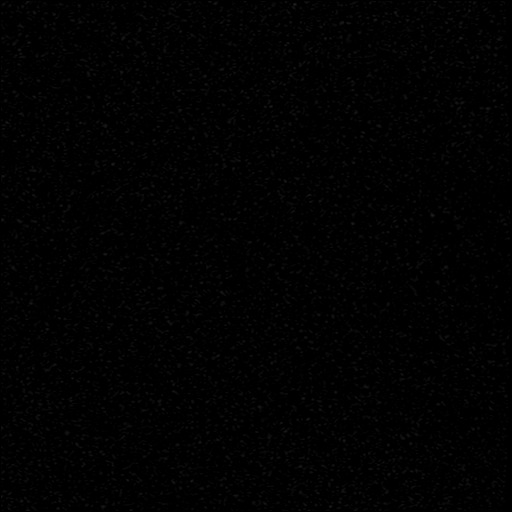
[im 4/28]
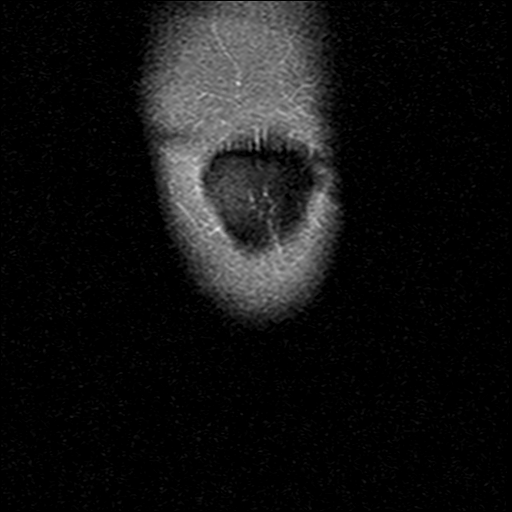
[im 8/28]
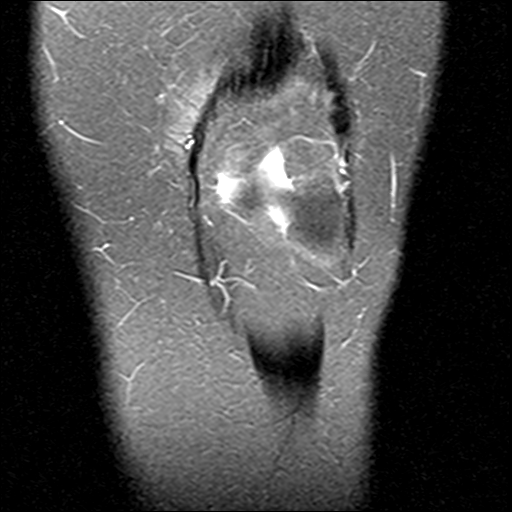
[im 12/28]
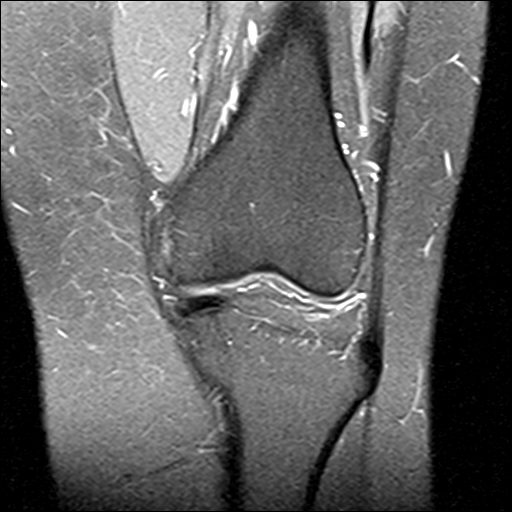
[im 16/28]
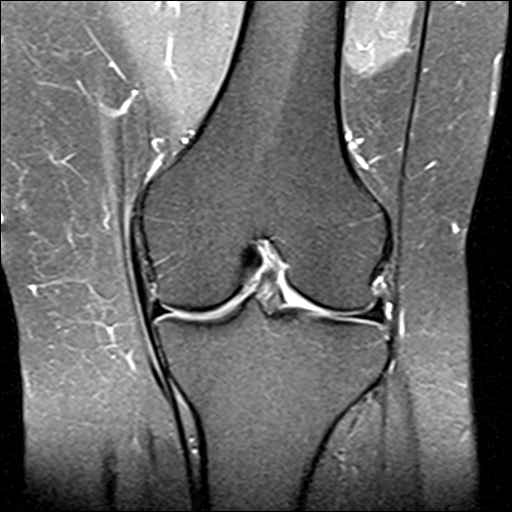
[im 20/28]
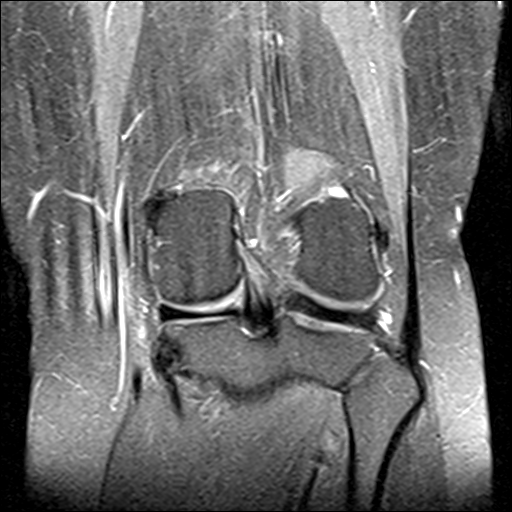
[im 24/28]
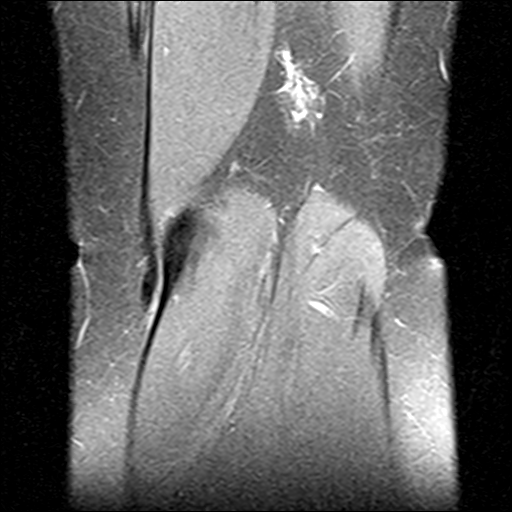

[21 of 40 positions shown; findings below may reference images not displayed]

FINDINGS: MENISCI

Medial meniscus:  Intact.

Lateral meniscus:  Intact.

LIGAMENTS

Cruciates: Intact ACL and PCL.

Collaterals: Intact MCL with minimal periligamentous edema. Lateral
collateral ligament complex intact.

CARTILAGE

Patellofemoral:  No chondral defect.

Medial:  No chondral defect.

Lateral:  No chondral defect.

MISCELLANEOUS

Joint:  No joint effusion. Fat pads within normal limits.

Popliteal Fossa:  No Baker's cyst. Intact popliteus tendon.

Extensor Mechanism:  Intact quadriceps and patellar tendons.

Bones: No acute fracture. No dislocation. No bone marrow edema. No
marrow replacing bone lesion.

Other: No significant periarticular soft tissue findings. Normal
muscle bulk and signal intensity.
IMPRESSION: 1. Subacute/resolving grade 1 MCL sprain.
2. Otherwise normal MRI of the left knee.

## 2021-12-25 DIAGNOSIS — Z419 Encounter for procedure for purposes other than remedying health state, unspecified: Secondary | ICD-10-CM | POA: Diagnosis not present

## 2022-01-04 ENCOUNTER — Encounter (HOSPITAL_BASED_OUTPATIENT_CLINIC_OR_DEPARTMENT_OTHER): Payer: Self-pay | Admitting: Emergency Medicine

## 2022-01-04 ENCOUNTER — Other Ambulatory Visit: Payer: Self-pay

## 2022-01-04 ENCOUNTER — Emergency Department (HOSPITAL_BASED_OUTPATIENT_CLINIC_OR_DEPARTMENT_OTHER)
Admission: EM | Admit: 2022-01-04 | Discharge: 2022-01-04 | Discharge status: Against Medical Advice | Payer: Medicaid Other | Attending: Emergency Medicine | Admitting: Emergency Medicine

## 2022-01-04 DIAGNOSIS — Z5321 Procedure and treatment not carried out due to patient leaving prior to being seen by health care provider: Secondary | ICD-10-CM | POA: Diagnosis not present

## 2022-01-04 DIAGNOSIS — K0889 Other specified disorders of teeth and supporting structures: Secondary | ICD-10-CM | POA: Diagnosis not present

## 2022-01-04 NOTE — ED Triage Notes (Signed)
Patient c/o right lower dental pain for the last 3 days.

## 2022-01-25 DIAGNOSIS — Z419 Encounter for procedure for purposes other than remedying health state, unspecified: Secondary | ICD-10-CM | POA: Diagnosis not present

## 2022-02-25 DIAGNOSIS — Z419 Encounter for procedure for purposes other than remedying health state, unspecified: Secondary | ICD-10-CM | POA: Diagnosis not present

## 2022-03-16 DIAGNOSIS — M4807 Spinal stenosis, lumbosacral region: Secondary | ICD-10-CM | POA: Diagnosis not present

## 2022-03-26 DIAGNOSIS — Z419 Encounter for procedure for purposes other than remedying health state, unspecified: Secondary | ICD-10-CM | POA: Diagnosis not present

## 2022-03-29 ENCOUNTER — Telehealth: Payer: Self-pay

## 2022-03-29 NOTE — Telephone Encounter (Signed)
Mychart msg sent

## 2022-03-31 ENCOUNTER — Other Ambulatory Visit: Payer: Self-pay

## 2022-03-31 ENCOUNTER — Emergency Department (HOSPITAL_BASED_OUTPATIENT_CLINIC_OR_DEPARTMENT_OTHER)
Admission: EM | Admit: 2022-03-31 | Discharge: 2022-03-31 | Disposition: A | Discharge status: Home / Self Care | Payer: Medicaid Other | Attending: Emergency Medicine | Admitting: Emergency Medicine

## 2022-03-31 ENCOUNTER — Emergency Department (HOSPITAL_BASED_OUTPATIENT_CLINIC_OR_DEPARTMENT_OTHER): Payer: Medicaid Other | Admitting: Radiology

## 2022-03-31 ENCOUNTER — Encounter (HOSPITAL_BASED_OUTPATIENT_CLINIC_OR_DEPARTMENT_OTHER): Payer: Self-pay

## 2022-03-31 ENCOUNTER — Emergency Department (HOSPITAL_BASED_OUTPATIENT_CLINIC_OR_DEPARTMENT_OTHER): Payer: Medicaid Other

## 2022-03-31 DIAGNOSIS — Y92410 Unspecified street and highway as the place of occurrence of the external cause: Secondary | ICD-10-CM | POA: Diagnosis not present

## 2022-03-31 DIAGNOSIS — M545 Low back pain, unspecified: Secondary | ICD-10-CM | POA: Insufficient documentation

## 2022-03-31 DIAGNOSIS — Z041 Encounter for examination and observation following transport accident: Secondary | ICD-10-CM | POA: Diagnosis not present

## 2022-03-31 DIAGNOSIS — M25552 Pain in left hip: Secondary | ICD-10-CM | POA: Insufficient documentation

## 2022-03-31 DIAGNOSIS — M542 Cervicalgia: Secondary | ICD-10-CM | POA: Diagnosis not present

## 2022-03-31 LAB — PREGNANCY, URINE: Preg Test, Ur: NEGATIVE

## 2022-03-31 NOTE — Discharge Instructions (Signed)
Note the workup today was overall reassuring.  Imaging studies were negative for any acute fracture or dislocation.  As discussed, recommend taking at home Tylenol/Motrin as needed for baseline pain with prescribed oxycodone from the pain doctors for breakthrough pain.  Recommend follow-up with your spinal specialist regarding back pain.  Please do not hesitate to return to emergency department for worrisome signs and symptoms we discussed become apparent.

## 2022-03-31 NOTE — ED Notes (Signed)
Reviewed AVS/discharge instruction with patient. Time allotted for and all questions answered. Patient is agreeable for d/c and escorted to ed exit by staff.  
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
## 2022-03-31 NOTE — ED Notes (Signed)
Ambulatory to restroom

## 2022-04-02 ENCOUNTER — Telehealth: Payer: Self-pay | Admitting: *Deleted

## 2022-04-02 NOTE — Transitions of Care (Post Inpatient/ED Visit) (Signed)
   04/02/2022  Name: MAIDA WIDGER MRN: [id number] DOB: 18-Dec-1980  Today's TOC FU Call Status: Today's TOC FU Call Status:: Unsuccessul Call (1st Attempt) Unsuccessful Call (1st Attempt) Date: 04/02/22  Attempted to reach the patient regarding the most recent Inpatient/ED visit.  Follow Up Plan: Additional outreach attempts will be made to reach the patient to complete the Transitions of Care (Post Inpatient/ED visit) call.   Lurena Joiner RN, BSN [place]  [employer] manager

## 2022-04-06 ENCOUNTER — Telehealth: Payer: Self-pay | Admitting: Licensed Clinical Social Worker
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
## 2022-04-26 DIAGNOSIS — Z419 Encounter for procedure for purposes other than remedying health state, unspecified: Secondary | ICD-10-CM | POA: Diagnosis not present

## 2022-05-26 DIAGNOSIS — Z419 Encounter for procedure for purposes other than remedying health state, unspecified: Secondary | ICD-10-CM | POA: Diagnosis not present

## 2022-06-26 DIAGNOSIS — Z419 Encounter for procedure for purposes other than remedying health state, unspecified: Secondary | ICD-10-CM | POA: Diagnosis not present

## 2022-07-15 DIAGNOSIS — M5416 Radiculopathy, lumbar region: Secondary | ICD-10-CM | POA: Diagnosis not present

## 2022-07-15 DIAGNOSIS — M25512 Pain in left shoulder: Secondary | ICD-10-CM | POA: Diagnosis not present

## 2022-07-15 DIAGNOSIS — M5412 Radiculopathy, cervical region: Secondary | ICD-10-CM | POA: Diagnosis not present

## 2022-07-26 DIAGNOSIS — Z419 Encounter for procedure for purposes other than remedying health state, unspecified: Secondary | ICD-10-CM | POA: Diagnosis not present

## 2022-07-28 DIAGNOSIS — N644 Mastodynia: Secondary | ICD-10-CM | POA: Diagnosis not present

## 2022-07-28 DIAGNOSIS — R634 Abnormal weight loss: Secondary | ICD-10-CM | POA: Diagnosis not present

## 2022-07-28 DIAGNOSIS — N6452 Nipple discharge: Secondary | ICD-10-CM | POA: Diagnosis not present

## 2022-08-18 DIAGNOSIS — M5416 Radiculopathy, lumbar region: Secondary | ICD-10-CM | POA: Diagnosis not present

## 2022-08-18 DIAGNOSIS — M5412 Radiculopathy, cervical region: Secondary | ICD-10-CM | POA: Diagnosis not present

## 2022-08-26 DIAGNOSIS — Z419 Encounter for procedure for purposes other than remedying health state, unspecified: Secondary | ICD-10-CM | POA: Diagnosis not present

## 2022-08-30 DIAGNOSIS — B370 Candidal stomatitis: Secondary | ICD-10-CM | POA: Diagnosis not present

## 2022-09-08 DIAGNOSIS — M5412 Radiculopathy, cervical region: Secondary | ICD-10-CM | POA: Diagnosis not present

## 2022-09-08 DIAGNOSIS — G894 Chronic pain syndrome: Secondary | ICD-10-CM | POA: Diagnosis not present

## 2022-09-08 DIAGNOSIS — M5416 Radiculopathy, lumbar region: Secondary | ICD-10-CM | POA: Diagnosis not present

## 2022-09-26 DIAGNOSIS — Z419 Encounter for procedure for purposes other than remedying health state, unspecified: Secondary | ICD-10-CM | POA: Diagnosis not present

## 2022-10-26 DIAGNOSIS — Z419 Encounter for procedure for purposes other than remedying health state, unspecified: Secondary | ICD-10-CM | POA: Diagnosis not present

## 2022-10-28 DIAGNOSIS — Z3202 Encounter for pregnancy test, result negative: Secondary | ICD-10-CM | POA: Diagnosis not present

## 2022-10-28 DIAGNOSIS — Z0189 Encounter for other specified special examinations: Secondary | ICD-10-CM | POA: Diagnosis not present

## 2022-10-29 DIAGNOSIS — Z3200 Encounter for pregnancy test, result unknown: Secondary | ICD-10-CM | POA: Diagnosis not present

## 2022-11-26 DIAGNOSIS — Z419 Encounter for procedure for purposes other than remedying health state, unspecified: Secondary | ICD-10-CM | POA: Diagnosis not present

## 2022-12-16 DIAGNOSIS — M5416 Radiculopathy, lumbar region: Secondary | ICD-10-CM | POA: Diagnosis not present

## 2022-12-16 DIAGNOSIS — G894 Chronic pain syndrome: Secondary | ICD-10-CM | POA: Diagnosis not present

## 2022-12-26 DIAGNOSIS — Z419 Encounter for procedure for purposes other than remedying health state, unspecified: Secondary | ICD-10-CM | POA: Diagnosis not present

## 2023-01-05 DIAGNOSIS — M5416 Radiculopathy, lumbar region: Secondary | ICD-10-CM | POA: Diagnosis not present

## 2023-01-26 DIAGNOSIS — Z419 Encounter for procedure for purposes other than remedying health state, unspecified: Secondary | ICD-10-CM | POA: Diagnosis not present

## 2023-02-08 DIAGNOSIS — D539 Nutritional anemia, unspecified: Secondary | ICD-10-CM | POA: Diagnosis not present

## 2023-02-08 DIAGNOSIS — M129 Arthropathy, unspecified: Secondary | ICD-10-CM | POA: Diagnosis not present

## 2023-02-08 DIAGNOSIS — R0602 Shortness of breath: Secondary | ICD-10-CM | POA: Diagnosis not present

## 2023-02-08 DIAGNOSIS — E559 Vitamin D deficiency, unspecified: Secondary | ICD-10-CM | POA: Diagnosis not present

## 2023-02-08 DIAGNOSIS — E7800 Pure hypercholesterolemia, unspecified: Secondary | ICD-10-CM | POA: Diagnosis not present

## 2023-02-08 DIAGNOSIS — Z1159 Encounter for screening for other viral diseases: Secondary | ICD-10-CM | POA: Diagnosis not present

## 2023-02-08 DIAGNOSIS — Z3200 Encounter for pregnancy test, result unknown: Secondary | ICD-10-CM | POA: Diagnosis not present

## 2023-02-08 DIAGNOSIS — M549 Dorsalgia, unspecified: Secondary | ICD-10-CM | POA: Diagnosis not present

## 2023-02-08 DIAGNOSIS — R030 Elevated blood-pressure reading, without diagnosis of hypertension: Secondary | ICD-10-CM | POA: Diagnosis not present

## 2023-02-08 DIAGNOSIS — R5383 Other fatigue: Secondary | ICD-10-CM | POA: Diagnosis not present

## 2023-02-08 DIAGNOSIS — F17210 Nicotine dependence, cigarettes, uncomplicated: Secondary | ICD-10-CM | POA: Diagnosis not present

## 2023-02-08 DIAGNOSIS — Z79899 Other long term (current) drug therapy: Secondary | ICD-10-CM | POA: Diagnosis not present

## 2023-02-10 DIAGNOSIS — Z79899 Other long term (current) drug therapy: Secondary | ICD-10-CM | POA: Diagnosis not present

## 2023-02-15 DIAGNOSIS — M4186 Other forms of scoliosis, lumbar region: Secondary | ICD-10-CM | POA: Diagnosis not present

## 2023-02-15 DIAGNOSIS — Z79899 Other long term (current) drug therapy: Secondary | ICD-10-CM | POA: Diagnosis not present

## 2023-02-15 DIAGNOSIS — M4184 Other forms of scoliosis, thoracic region: Secondary | ICD-10-CM | POA: Diagnosis not present

## 2023-02-15 DIAGNOSIS — Z981 Arthrodesis status: Secondary | ICD-10-CM | POA: Diagnosis not present

## 2023-02-15 DIAGNOSIS — Z124 Encounter for screening for malignant neoplasm of cervix: Secondary | ICD-10-CM | POA: Diagnosis not present

## 2023-02-15 DIAGNOSIS — Z131 Encounter for screening for diabetes mellitus: Secondary | ICD-10-CM | POA: Diagnosis not present

## 2023-02-15 DIAGNOSIS — F17210 Nicotine dependence, cigarettes, uncomplicated: Secondary | ICD-10-CM | POA: Diagnosis not present

## 2023-02-15 DIAGNOSIS — E559 Vitamin D deficiency, unspecified: Secondary | ICD-10-CM | POA: Diagnosis not present

## 2023-02-15 DIAGNOSIS — Z3200 Encounter for pregnancy test, result unknown: Secondary | ICD-10-CM | POA: Diagnosis not present

## 2023-02-15 DIAGNOSIS — E7800 Pure hypercholesterolemia, unspecified: Secondary | ICD-10-CM | POA: Diagnosis not present

## 2023-02-15 DIAGNOSIS — Z1509 Genetic susceptibility to other malignant neoplasm: Secondary | ICD-10-CM | POA: Diagnosis not present

## 2023-02-18 ENCOUNTER — Inpatient Hospital Stay: Payer: No Typology Code available for payment source | Attending: Genetic Counselor | Admitting: Genetic Counselor

## 2023-02-18 ENCOUNTER — Inpatient Hospital Stay: Payer: No Typology Code available for payment source

## 2023-02-18 DIAGNOSIS — Z79899 Other long term (current) drug therapy: Secondary | ICD-10-CM | POA: Diagnosis not present

## 2023-02-26 DIAGNOSIS — Z419 Encounter for procedure for purposes other than remedying health state, unspecified: Secondary | ICD-10-CM | POA: Diagnosis not present

## 2023-03-01 ENCOUNTER — Telehealth: Payer: Self-pay | Admitting: Genetic Counselor

## 2023-03-01 NOTE — Telephone Encounter (Signed)
Scheduled appointments per patients request via incoming call. Patient is aware of the made appointments.

## 2023-03-18 DIAGNOSIS — F419 Anxiety disorder, unspecified: Secondary | ICD-10-CM | POA: Diagnosis not present

## 2023-03-18 DIAGNOSIS — Z1231 Encounter for screening mammogram for malignant neoplasm of breast: Secondary | ICD-10-CM | POA: Diagnosis not present

## 2023-03-18 DIAGNOSIS — E7800 Pure hypercholesterolemia, unspecified: Secondary | ICD-10-CM | POA: Diagnosis not present

## 2023-03-18 DIAGNOSIS — F17210 Nicotine dependence, cigarettes, uncomplicated: Secondary | ICD-10-CM | POA: Diagnosis not present

## 2023-03-18 DIAGNOSIS — Z1509 Genetic susceptibility to other malignant neoplasm: Secondary | ICD-10-CM | POA: Diagnosis not present

## 2023-03-18 DIAGNOSIS — Z3200 Encounter for pregnancy test, result unknown: Secondary | ICD-10-CM | POA: Diagnosis not present

## 2023-03-18 DIAGNOSIS — M4184 Other forms of scoliosis, thoracic region: Secondary | ICD-10-CM | POA: Diagnosis not present

## 2023-03-18 DIAGNOSIS — F32A Depression, unspecified: Secondary | ICD-10-CM | POA: Diagnosis not present

## 2023-03-18 DIAGNOSIS — E559 Vitamin D deficiency, unspecified: Secondary | ICD-10-CM | POA: Diagnosis not present

## 2023-03-18 DIAGNOSIS — M4186 Other forms of scoliosis, lumbar region: Secondary | ICD-10-CM | POA: Diagnosis not present

## 2023-03-18 DIAGNOSIS — Z79899 Other long term (current) drug therapy: Secondary | ICD-10-CM | POA: Diagnosis not present

## 2023-03-23 DIAGNOSIS — Z79899 Other long term (current) drug therapy: Secondary | ICD-10-CM | POA: Diagnosis not present

## 2023-03-26 DIAGNOSIS — Z419 Encounter for procedure for purposes other than remedying health state, unspecified: Secondary | ICD-10-CM | POA: Diagnosis not present

## 2023-04-16 DIAGNOSIS — Z981 Arthrodesis status: Secondary | ICD-10-CM | POA: Diagnosis not present

## 2023-04-16 DIAGNOSIS — M4186 Other forms of scoliosis, lumbar region: Secondary | ICD-10-CM | POA: Diagnosis not present

## 2023-04-16 DIAGNOSIS — E559 Vitamin D deficiency, unspecified: Secondary | ICD-10-CM | POA: Diagnosis not present

## 2023-04-16 DIAGNOSIS — E7800 Pure hypercholesterolemia, unspecified: Secondary | ICD-10-CM | POA: Diagnosis not present

## 2023-04-16 DIAGNOSIS — M4184 Other forms of scoliosis, thoracic region: Secondary | ICD-10-CM | POA: Diagnosis not present

## 2023-04-16 DIAGNOSIS — F419 Anxiety disorder, unspecified: Secondary | ICD-10-CM | POA: Diagnosis not present

## 2023-04-16 DIAGNOSIS — F17210 Nicotine dependence, cigarettes, uncomplicated: Secondary | ICD-10-CM | POA: Diagnosis not present

## 2023-04-16 DIAGNOSIS — Z79899 Other long term (current) drug therapy: Secondary | ICD-10-CM | POA: Diagnosis not present

## 2023-04-16 DIAGNOSIS — N914 Secondary oligomenorrhea: Secondary | ICD-10-CM | POA: Diagnosis not present

## 2023-04-16 DIAGNOSIS — R5383 Other fatigue: Secondary | ICD-10-CM | POA: Diagnosis not present

## 2023-04-16 DIAGNOSIS — M62838 Other muscle spasm: Secondary | ICD-10-CM | POA: Diagnosis not present

## 2023-04-20 DIAGNOSIS — Z79899 Other long term (current) drug therapy: Secondary | ICD-10-CM | POA: Diagnosis not present

## 2023-04-22 ENCOUNTER — Inpatient Hospital Stay: Payer: PRIVATE HEALTH INSURANCE | Attending: Genetic Counselor | Admitting: Genetic Counselor

## 2023-04-22 ENCOUNTER — Inpatient Hospital Stay: Payer: PRIVATE HEALTH INSURANCE

## 2023-05-07 DIAGNOSIS — Z419 Encounter for procedure for purposes other than remedying health state, unspecified: Secondary | ICD-10-CM | POA: Diagnosis not present

## 2023-05-19 DIAGNOSIS — F419 Anxiety disorder, unspecified: Secondary | ICD-10-CM | POA: Diagnosis not present

## 2023-05-19 DIAGNOSIS — E559 Vitamin D deficiency, unspecified: Secondary | ICD-10-CM | POA: Diagnosis not present

## 2023-05-19 DIAGNOSIS — M4184 Other forms of scoliosis, thoracic region: Secondary | ICD-10-CM | POA: Diagnosis not present

## 2023-05-19 DIAGNOSIS — N914 Secondary oligomenorrhea: Secondary | ICD-10-CM | POA: Diagnosis not present

## 2023-05-19 DIAGNOSIS — F17210 Nicotine dependence, cigarettes, uncomplicated: Secondary | ICD-10-CM | POA: Diagnosis not present

## 2023-05-19 DIAGNOSIS — M62838 Other muscle spasm: Secondary | ICD-10-CM | POA: Diagnosis not present

## 2023-05-19 DIAGNOSIS — E7800 Pure hypercholesterolemia, unspecified: Secondary | ICD-10-CM | POA: Diagnosis not present

## 2023-05-19 DIAGNOSIS — R29818 Other symptoms and signs involving the nervous system: Secondary | ICD-10-CM | POA: Diagnosis not present

## 2023-05-19 DIAGNOSIS — M25562 Pain in left knee: Secondary | ICD-10-CM | POA: Diagnosis not present

## 2023-05-19 DIAGNOSIS — Z79899 Other long term (current) drug therapy: Secondary | ICD-10-CM | POA: Diagnosis not present

## 2023-05-19 DIAGNOSIS — F32A Depression, unspecified: Secondary | ICD-10-CM | POA: Diagnosis not present

## 2023-05-19 DIAGNOSIS — R5383 Other fatigue: Secondary | ICD-10-CM | POA: Diagnosis not present

## 2023-05-24 DIAGNOSIS — Z79899 Other long term (current) drug therapy: Secondary | ICD-10-CM | POA: Diagnosis not present

## 2023-06-06 DIAGNOSIS — Z419 Encounter for procedure for purposes other than remedying health state, unspecified: Secondary | ICD-10-CM | POA: Diagnosis not present

## 2023-06-08 DIAGNOSIS — E559 Vitamin D deficiency, unspecified: Secondary | ICD-10-CM | POA: Diagnosis not present

## 2023-06-08 DIAGNOSIS — F17210 Nicotine dependence, cigarettes, uncomplicated: Secondary | ICD-10-CM | POA: Diagnosis not present

## 2023-06-08 DIAGNOSIS — M79604 Pain in right leg: Secondary | ICD-10-CM | POA: Diagnosis not present

## 2023-06-08 DIAGNOSIS — Z79899 Other long term (current) drug therapy: Secondary | ICD-10-CM | POA: Diagnosis not present

## 2023-06-08 DIAGNOSIS — F32A Depression, unspecified: Secondary | ICD-10-CM | POA: Diagnosis not present

## 2023-06-08 DIAGNOSIS — N914 Secondary oligomenorrhea: Secondary | ICD-10-CM | POA: Diagnosis not present

## 2023-06-08 DIAGNOSIS — M4186 Other forms of scoliosis, lumbar region: Secondary | ICD-10-CM | POA: Diagnosis not present

## 2023-06-08 DIAGNOSIS — M79605 Pain in left leg: Secondary | ICD-10-CM | POA: Diagnosis not present

## 2023-06-08 DIAGNOSIS — M4184 Other forms of scoliosis, thoracic region: Secondary | ICD-10-CM | POA: Diagnosis not present

## 2023-06-08 DIAGNOSIS — R0602 Shortness of breath: Secondary | ICD-10-CM | POA: Diagnosis not present

## 2023-06-08 DIAGNOSIS — M62838 Other muscle spasm: Secondary | ICD-10-CM | POA: Diagnosis not present

## 2023-06-13 DIAGNOSIS — Z79899 Other long term (current) drug therapy: Secondary | ICD-10-CM | POA: Diagnosis not present

## 2023-06-16 DIAGNOSIS — R0602 Shortness of breath: Secondary | ICD-10-CM | POA: Diagnosis not present

## 2023-06-16 DIAGNOSIS — F5101 Primary insomnia: Secondary | ICD-10-CM | POA: Diagnosis not present

## 2023-06-16 DIAGNOSIS — F419 Anxiety disorder, unspecified: Secondary | ICD-10-CM | POA: Diagnosis not present

## 2023-06-16 DIAGNOSIS — E559 Vitamin D deficiency, unspecified: Secondary | ICD-10-CM | POA: Diagnosis not present

## 2023-06-16 DIAGNOSIS — N914 Secondary oligomenorrhea: Secondary | ICD-10-CM | POA: Diagnosis not present

## 2023-06-16 DIAGNOSIS — M4186 Other forms of scoliosis, lumbar region: Secondary | ICD-10-CM | POA: Diagnosis not present

## 2023-06-16 DIAGNOSIS — Z79899 Other long term (current) drug therapy: Secondary | ICD-10-CM | POA: Diagnosis not present

## 2023-06-16 DIAGNOSIS — M62838 Other muscle spasm: Secondary | ICD-10-CM | POA: Diagnosis not present

## 2023-06-16 DIAGNOSIS — F17210 Nicotine dependence, cigarettes, uncomplicated: Secondary | ICD-10-CM | POA: Diagnosis not present

## 2023-06-16 DIAGNOSIS — M4184 Other forms of scoliosis, thoracic region: Secondary | ICD-10-CM | POA: Diagnosis not present

## 2023-06-16 DIAGNOSIS — E7800 Pure hypercholesterolemia, unspecified: Secondary | ICD-10-CM | POA: Diagnosis not present

## 2023-06-21 DIAGNOSIS — Z79899 Other long term (current) drug therapy: Secondary | ICD-10-CM | POA: Diagnosis not present

## 2023-07-05 DIAGNOSIS — M79605 Pain in left leg: Secondary | ICD-10-CM | POA: Diagnosis not present

## 2023-07-05 DIAGNOSIS — M79604 Pain in right leg: Secondary | ICD-10-CM | POA: Diagnosis not present

## 2023-07-07 DIAGNOSIS — Z419 Encounter for procedure for purposes other than remedying health state, unspecified: Secondary | ICD-10-CM | POA: Diagnosis not present

## 2023-07-15 DIAGNOSIS — N914 Secondary oligomenorrhea: Secondary | ICD-10-CM | POA: Diagnosis not present

## 2023-07-15 DIAGNOSIS — F5101 Primary insomnia: Secondary | ICD-10-CM | POA: Diagnosis not present

## 2023-07-15 DIAGNOSIS — E7800 Pure hypercholesterolemia, unspecified: Secondary | ICD-10-CM | POA: Diagnosis not present

## 2023-07-15 DIAGNOSIS — E559 Vitamin D deficiency, unspecified: Secondary | ICD-10-CM | POA: Diagnosis not present

## 2023-07-15 DIAGNOSIS — F32A Depression, unspecified: Secondary | ICD-10-CM | POA: Diagnosis not present

## 2023-07-15 DIAGNOSIS — Z79899 Other long term (current) drug therapy: Secondary | ICD-10-CM | POA: Diagnosis not present

## 2023-07-15 DIAGNOSIS — M4184 Other forms of scoliosis, thoracic region: Secondary | ICD-10-CM | POA: Diagnosis not present

## 2023-07-15 DIAGNOSIS — M62838 Other muscle spasm: Secondary | ICD-10-CM | POA: Diagnosis not present

## 2023-07-15 DIAGNOSIS — M25562 Pain in left knee: Secondary | ICD-10-CM | POA: Diagnosis not present

## 2023-07-15 DIAGNOSIS — M4186 Other forms of scoliosis, lumbar region: Secondary | ICD-10-CM | POA: Diagnosis not present

## 2023-07-15 DIAGNOSIS — F17210 Nicotine dependence, cigarettes, uncomplicated: Secondary | ICD-10-CM | POA: Diagnosis not present

## 2023-07-21 DIAGNOSIS — Z79899 Other long term (current) drug therapy: Secondary | ICD-10-CM | POA: Diagnosis not present

## 2023-08-06 DIAGNOSIS — Z419 Encounter for procedure for purposes other than remedying health state, unspecified: Secondary | ICD-10-CM | POA: Diagnosis not present

## 2023-08-11 DIAGNOSIS — M4184 Other forms of scoliosis, thoracic region: Secondary | ICD-10-CM | POA: Diagnosis not present

## 2023-08-11 DIAGNOSIS — M62838 Other muscle spasm: Secondary | ICD-10-CM | POA: Diagnosis not present

## 2023-08-11 DIAGNOSIS — F17210 Nicotine dependence, cigarettes, uncomplicated: Secondary | ICD-10-CM | POA: Diagnosis not present

## 2023-08-11 DIAGNOSIS — Z79899 Other long term (current) drug therapy: Secondary | ICD-10-CM | POA: Diagnosis not present

## 2023-08-11 DIAGNOSIS — N914 Secondary oligomenorrhea: Secondary | ICD-10-CM | POA: Diagnosis not present

## 2023-08-11 DIAGNOSIS — M4186 Other forms of scoliosis, lumbar region: Secondary | ICD-10-CM | POA: Diagnosis not present

## 2023-08-17 DIAGNOSIS — Z79899 Other long term (current) drug therapy: Secondary | ICD-10-CM | POA: Diagnosis not present

## 2023-08-23 DIAGNOSIS — G4710 Hypersomnia, unspecified: Secondary | ICD-10-CM | POA: Diagnosis not present

## 2023-08-23 DIAGNOSIS — E7800 Pure hypercholesterolemia, unspecified: Secondary | ICD-10-CM | POA: Diagnosis not present

## 2023-08-23 DIAGNOSIS — M542 Cervicalgia: Secondary | ICD-10-CM | POA: Diagnosis not present

## 2023-08-23 DIAGNOSIS — F17210 Nicotine dependence, cigarettes, uncomplicated: Secondary | ICD-10-CM | POA: Diagnosis not present

## 2023-09-06 DIAGNOSIS — Z419 Encounter for procedure for purposes other than remedying health state, unspecified: Secondary | ICD-10-CM | POA: Diagnosis not present

## 2023-09-11 DIAGNOSIS — N914 Secondary oligomenorrhea: Secondary | ICD-10-CM | POA: Diagnosis not present

## 2023-09-11 DIAGNOSIS — M4184 Other forms of scoliosis, thoracic region: Secondary | ICD-10-CM | POA: Diagnosis not present

## 2023-09-11 DIAGNOSIS — Z79899 Other long term (current) drug therapy: Secondary | ICD-10-CM | POA: Diagnosis not present

## 2023-09-11 DIAGNOSIS — F17210 Nicotine dependence, cigarettes, uncomplicated: Secondary | ICD-10-CM | POA: Diagnosis not present

## 2023-09-11 DIAGNOSIS — R030 Elevated blood-pressure reading, without diagnosis of hypertension: Secondary | ICD-10-CM | POA: Diagnosis not present

## 2023-09-11 DIAGNOSIS — M4186 Other forms of scoliosis, lumbar region: Secondary | ICD-10-CM | POA: Diagnosis not present

## 2023-09-11 DIAGNOSIS — M62838 Other muscle spasm: Secondary | ICD-10-CM | POA: Diagnosis not present

## 2023-10-07 DIAGNOSIS — Z419 Encounter for procedure for purposes other than remedying health state, unspecified: Secondary | ICD-10-CM | POA: Diagnosis not present

## 2023-10-12 DIAGNOSIS — F32A Depression, unspecified: Secondary | ICD-10-CM | POA: Diagnosis not present

## 2023-10-12 DIAGNOSIS — N914 Secondary oligomenorrhea: Secondary | ICD-10-CM | POA: Diagnosis not present

## 2023-10-12 DIAGNOSIS — M4186 Other forms of scoliosis, lumbar region: Secondary | ICD-10-CM | POA: Diagnosis not present

## 2023-10-12 DIAGNOSIS — M4184 Other forms of scoliosis, thoracic region: Secondary | ICD-10-CM | POA: Diagnosis not present

## 2023-10-12 DIAGNOSIS — E559 Vitamin D deficiency, unspecified: Secondary | ICD-10-CM | POA: Diagnosis not present

## 2023-10-12 DIAGNOSIS — Z79899 Other long term (current) drug therapy: Secondary | ICD-10-CM | POA: Diagnosis not present

## 2023-10-12 DIAGNOSIS — F419 Anxiety disorder, unspecified: Secondary | ICD-10-CM | POA: Diagnosis not present

## 2023-10-12 DIAGNOSIS — Z9889 Other specified postprocedural states: Secondary | ICD-10-CM | POA: Diagnosis not present

## 2023-10-12 DIAGNOSIS — E7800 Pure hypercholesterolemia, unspecified: Secondary | ICD-10-CM | POA: Diagnosis not present

## 2023-10-12 DIAGNOSIS — M25562 Pain in left knee: Secondary | ICD-10-CM | POA: Diagnosis not present

## 2023-10-12 DIAGNOSIS — F17210 Nicotine dependence, cigarettes, uncomplicated: Secondary | ICD-10-CM | POA: Diagnosis not present

## 2023-10-13 DIAGNOSIS — M5416 Radiculopathy, lumbar region: Secondary | ICD-10-CM | POA: Diagnosis not present

## 2023-10-13 DIAGNOSIS — M5412 Radiculopathy, cervical region: Secondary | ICD-10-CM | POA: Diagnosis not present

## 2023-10-13 DIAGNOSIS — G894 Chronic pain syndrome: Secondary | ICD-10-CM | POA: Diagnosis not present

## 2023-10-13 DIAGNOSIS — M461 Sacroiliitis, not elsewhere classified: Secondary | ICD-10-CM | POA: Diagnosis not present

## 2023-11-02 DIAGNOSIS — M461 Sacroiliitis, not elsewhere classified: Secondary | ICD-10-CM | POA: Diagnosis not present

## 2023-11-11 DIAGNOSIS — M62838 Other muscle spasm: Secondary | ICD-10-CM | POA: Diagnosis not present

## 2023-11-11 DIAGNOSIS — N914 Secondary oligomenorrhea: Secondary | ICD-10-CM | POA: Diagnosis not present

## 2023-11-11 DIAGNOSIS — F17210 Nicotine dependence, cigarettes, uncomplicated: Secondary | ICD-10-CM | POA: Diagnosis not present

## 2023-11-11 DIAGNOSIS — F419 Anxiety disorder, unspecified: Secondary | ICD-10-CM | POA: Diagnosis not present

## 2023-11-11 DIAGNOSIS — M4184 Other forms of scoliosis, thoracic region: Secondary | ICD-10-CM | POA: Diagnosis not present

## 2023-11-11 DIAGNOSIS — R0602 Shortness of breath: Secondary | ICD-10-CM | POA: Diagnosis not present

## 2023-11-11 DIAGNOSIS — E559 Vitamin D deficiency, unspecified: Secondary | ICD-10-CM | POA: Diagnosis not present

## 2023-11-11 DIAGNOSIS — R5383 Other fatigue: Secondary | ICD-10-CM | POA: Diagnosis not present

## 2023-11-11 DIAGNOSIS — D539 Nutritional anemia, unspecified: Secondary | ICD-10-CM | POA: Diagnosis not present

## 2023-11-11 DIAGNOSIS — E7800 Pure hypercholesterolemia, unspecified: Secondary | ICD-10-CM | POA: Diagnosis not present

## 2023-11-11 DIAGNOSIS — Z79899 Other long term (current) drug therapy: Secondary | ICD-10-CM | POA: Diagnosis not present

## 2023-11-11 DIAGNOSIS — Z0000 Encounter for general adult medical examination without abnormal findings: Secondary | ICD-10-CM | POA: Diagnosis not present

## 2023-11-21 DIAGNOSIS — Z79899 Other long term (current) drug therapy: Secondary | ICD-10-CM | POA: Diagnosis not present

## 2023-11-24 DIAGNOSIS — M5412 Radiculopathy, cervical region: Secondary | ICD-10-CM | POA: Diagnosis not present

## 2023-11-24 DIAGNOSIS — G894 Chronic pain syndrome: Secondary | ICD-10-CM | POA: Diagnosis not present

## 2023-11-24 DIAGNOSIS — M461 Sacroiliitis, not elsewhere classified: Secondary | ICD-10-CM | POA: Diagnosis not present

## 2023-12-08 DIAGNOSIS — Z1231 Encounter for screening mammogram for malignant neoplasm of breast: Secondary | ICD-10-CM | POA: Diagnosis not present

## 2023-12-08 DIAGNOSIS — R92323 Mammographic fibroglandular density, bilateral breasts: Secondary | ICD-10-CM | POA: Diagnosis not present

## 2023-12-09 DIAGNOSIS — M62838 Other muscle spasm: Secondary | ICD-10-CM | POA: Diagnosis not present

## 2023-12-09 DIAGNOSIS — F17210 Nicotine dependence, cigarettes, uncomplicated: Secondary | ICD-10-CM | POA: Diagnosis not present

## 2023-12-09 DIAGNOSIS — M4184 Other forms of scoliosis, thoracic region: Secondary | ICD-10-CM | POA: Diagnosis not present

## 2023-12-09 DIAGNOSIS — N914 Secondary oligomenorrhea: Secondary | ICD-10-CM | POA: Diagnosis not present

## 2023-12-12 DIAGNOSIS — Z79899 Other long term (current) drug therapy: Secondary | ICD-10-CM | POA: Diagnosis not present

## 2024-01-04 DIAGNOSIS — M461 Sacroiliitis, not elsewhere classified: Secondary | ICD-10-CM | POA: Diagnosis not present

## 2024-01-06 DIAGNOSIS — Z419 Encounter for procedure for purposes other than remedying health state, unspecified: Secondary | ICD-10-CM | POA: Diagnosis not present

## 2024-01-09 DIAGNOSIS — F17210 Nicotine dependence, cigarettes, uncomplicated: Secondary | ICD-10-CM | POA: Diagnosis not present

## 2024-01-09 DIAGNOSIS — M4184 Other forms of scoliosis, thoracic region: Secondary | ICD-10-CM | POA: Diagnosis not present

## 2024-01-09 DIAGNOSIS — F419 Anxiety disorder, unspecified: Secondary | ICD-10-CM | POA: Diagnosis not present

## 2024-01-09 DIAGNOSIS — N914 Secondary oligomenorrhea: Secondary | ICD-10-CM | POA: Diagnosis not present

## 2024-01-09 DIAGNOSIS — M62838 Other muscle spasm: Secondary | ICD-10-CM | POA: Diagnosis not present

## 2024-01-09 DIAGNOSIS — F32A Depression, unspecified: Secondary | ICD-10-CM | POA: Diagnosis not present

## 2024-01-09 DIAGNOSIS — E7800 Pure hypercholesterolemia, unspecified: Secondary | ICD-10-CM | POA: Diagnosis not present

## 2024-01-09 DIAGNOSIS — E559 Vitamin D deficiency, unspecified: Secondary | ICD-10-CM | POA: Diagnosis not present

## 2024-01-09 DIAGNOSIS — Z79899 Other long term (current) drug therapy: Secondary | ICD-10-CM | POA: Diagnosis not present

## 2024-01-09 DIAGNOSIS — M25562 Pain in left knee: Secondary | ICD-10-CM | POA: Diagnosis not present
